# Patient Record
Sex: Male | Born: 1940 | Race: White | Hispanic: No | Marital: Married | Smoking: Former smoker
Health system: Southern US, Community
[De-identification: ages and names within clinical notes are randomized; demographics above are authoritative.]

## PROBLEM LIST (undated history)

## (undated) DIAGNOSIS — C801 Malignant (primary) neoplasm, unspecified: Secondary | ICD-10-CM

## (undated) DIAGNOSIS — C7931 Secondary malignant neoplasm of brain: Secondary | ICD-10-CM

## (undated) DIAGNOSIS — C3490 Malignant neoplasm of unspecified part of unspecified bronchus or lung: Secondary | ICD-10-CM

## (undated) DIAGNOSIS — M1990 Unspecified osteoarthritis, unspecified site: Secondary | ICD-10-CM

## (undated) DIAGNOSIS — I2119 ST elevation (STEMI) myocardial infarction involving other coronary artery of inferior wall: Secondary | ICD-10-CM

## (undated) DIAGNOSIS — R51 Headache: Secondary | ICD-10-CM

## (undated) DIAGNOSIS — I739 Peripheral vascular disease, unspecified: Secondary | ICD-10-CM

## (undated) DIAGNOSIS — I2510 Atherosclerotic heart disease of native coronary artery without angina pectoris: Secondary | ICD-10-CM

## (undated) HISTORY — PX: HEMORROIDECTOMY: SUR656

## (undated) HISTORY — DX: Secondary malignant neoplasm of brain: C79.31

## (undated) HISTORY — DX: Secondary malignant neoplasm of brain: C80.1

## (undated) HISTORY — DX: Malignant neoplasm of unspecified part of unspecified bronchus or lung: C34.90

---

## 2010-12-12 ENCOUNTER — Encounter (HOSPITAL_COMMUNITY)
Admission: EM | Disposition: A | Discharge status: Home / Self Care | Payer: Self-pay | Source: Home / Self Care | Attending: Cardiology

## 2010-12-12 ENCOUNTER — Encounter: Payer: Self-pay | Admitting: Emergency Medicine

## 2010-12-12 ENCOUNTER — Other Ambulatory Visit: Payer: Self-pay

## 2010-12-12 ENCOUNTER — Inpatient Hospital Stay (HOSPITAL_COMMUNITY)
Admission: EM | Admit: 2010-12-12 | Discharge: 2010-12-15 | DRG: 247 | Disposition: A | Discharge status: Home / Self Care | Payer: Medicare Other | Attending: Cardiology | Admitting: Cardiology

## 2010-12-12 ENCOUNTER — Inpatient Hospital Stay (HOSPITAL_COMMUNITY): Payer: Medicare Other

## 2010-12-12 DIAGNOSIS — M129 Arthropathy, unspecified: Secondary | ICD-10-CM | POA: Diagnosis present

## 2010-12-12 DIAGNOSIS — R51 Headache: Secondary | ICD-10-CM | POA: Diagnosis present

## 2010-12-12 DIAGNOSIS — I708 Atherosclerosis of other arteries: Secondary | ICD-10-CM | POA: Diagnosis present

## 2010-12-12 DIAGNOSIS — I10 Essential (primary) hypertension: Secondary | ICD-10-CM | POA: Diagnosis present

## 2010-12-12 DIAGNOSIS — R079 Chest pain, unspecified: Secondary | ICD-10-CM

## 2010-12-12 DIAGNOSIS — I2119 ST elevation (STEMI) myocardial infarction involving other coronary artery of inferior wall: Principal | ICD-10-CM | POA: Diagnosis present

## 2010-12-12 DIAGNOSIS — Z7982 Long term (current) use of aspirin: Secondary | ICD-10-CM

## 2010-12-12 DIAGNOSIS — I2510 Atherosclerotic heart disease of native coronary artery without angina pectoris: Secondary | ICD-10-CM

## 2010-12-12 DIAGNOSIS — I739 Peripheral vascular disease, unspecified: Secondary | ICD-10-CM | POA: Diagnosis present

## 2010-12-12 HISTORY — DX: ST elevation (STEMI) myocardial infarction involving other coronary artery of inferior wall: I21.19

## 2010-12-12 HISTORY — DX: Unspecified osteoarthritis, unspecified site: M19.90

## 2010-12-12 HISTORY — PX: PERCUTANEOUS CORONARY STENT INTERVENTION (PCI-S): SHX5485

## 2010-12-12 HISTORY — PX: LEFT HEART CATHETERIZATION WITH CORONARY ANGIOGRAM: SHX5451

## 2010-12-12 HISTORY — DX: Peripheral vascular disease, unspecified: I73.9

## 2010-12-12 HISTORY — DX: Headache: R51

## 2010-12-12 HISTORY — DX: Atherosclerotic heart disease of native coronary artery without angina pectoris: I25.10

## 2010-12-12 SURGERY — LEFT HEART CATHETERIZATION WITH CORONARY ANGIOGRAM
Anesthesia: LOCAL

## 2010-12-12 MED ORDER — NITROGLYCERIN 0.2 MG/ML ON CALL CATH LAB
INTRAVENOUS | Status: AC
  Filled 2010-12-12: qty 1

## 2010-12-12 MED ORDER — PRASUGREL HCL 10 MG PO TABS
ORAL_TABLET | ORAL | Status: AC
  Administered 2010-12-13: 10 mg via ORAL
  Filled 2010-12-12: qty 6

## 2010-12-12 MED ORDER — BIVALIRUDIN 250 MG IV SOLR
INTRAVENOUS | Status: AC
Start: 2010-12-12 — End: 2010-12-12
  Filled 2010-12-12: qty 250

## 2010-12-12 MED ORDER — LIDOCAINE HCL (PF) 1 % IJ SOLN
INTRAMUSCULAR | Status: AC
  Filled 2010-12-12: qty 30

## 2010-12-12 MED ORDER — HEPARIN (PORCINE) IN NACL 2-0.9 UNIT/ML-% IJ SOLN
INTRAMUSCULAR | Status: AC
Start: 2010-12-12 — End: 2010-12-12
  Filled 2010-12-12: qty 2000

## 2010-12-12 MED ORDER — SODIUM CHLORIDE 0.9 % IV SOLN
Freq: Once | INTRAVENOUS | Status: AC
  Administered 2010-12-13: via INTRAVENOUS

## 2010-12-12 MED ORDER — FENTANYL CITRATE 0.05 MG/ML IJ SOLN
INTRAMUSCULAR | Status: AC
  Filled 2010-12-12: qty 2

## 2010-12-12 MED ORDER — MIDAZOLAM HCL 2 MG/2ML IJ SOLN
INTRAMUSCULAR | Status: AC
  Filled 2010-12-12: qty 2

## 2010-12-12 MED ORDER — METOPROLOL TARTRATE 25 MG PO TABS
25.0000 mg | ORAL_TABLET | Freq: Two times a day (BID) | ORAL | Status: DC
  Administered 2010-12-13 – 2010-12-15 (×6): 25 mg via ORAL
  Filled 2010-12-12 (×7): qty 1

## 2010-12-12 MED ORDER — HEPARIN SODIUM (PORCINE) 5000 UNIT/ML IJ SOLN
INTRAMUSCULAR | Status: AC
  Filled 2010-12-12: qty 1

## 2010-12-12 NOTE — ED Notes (Signed)
Per EMS:  Patient reports Saturday lifted up playhouse and thought pulled muscle, substernal chest pain intermittent, took ASA at home to relieve pain, ongoing and tonight pain worsen and finally called EMS.  324mg  ASA by EMS, and 2 Nitro sl, pain initially 10/10, now pain 1/10.  Denies SOB, NV.  Complaints, chest pain, left arm and left shoulder pain.

## 2010-12-12 NOTE — ED Notes (Signed)
4000 units heparin IV given by [facility] RN

## 2010-12-12 NOTE — Op Note (Signed)
Cardiac Catheterization Procedure Note  Name: Charles Luna MRN: [id number] DOB: 1940-11-16  Procedure: Left Heart Cath, Selective Coronary Angiography, LV angiography,  PTCA/Stent of RCA  Indication: acute inferior MI   Diagnostic Procedure Details: The right groin was prepped, draped, and anesthetized with 1% lidocaine. Using the modified Seldinger technique, a 6 French sheath was introduced into the right femoral artery. Standard Judkins catheters were used for selective coronary angiography and left ventriculography. Catheter exchanges were performed over a wire.  The diagnostic procedure was well-tolerated without immediate complications.  A JR-4 guide catheter was utilized to cannulate the RCA.  ACT was checked after administration of bivalirudin and was appropriate.  ISTAT was obtained to confirm cr and Hgb.  The RCA lesion was then predilated with a short 2.25 mm compliant balloon, and then the vessel was stented using a 2.75 by 20mm DES Promus Element stent.  Post dil was done with a 2.75 Ragsdale balloon.  LV angiography was performed with a pigtail.  Following this a femoral angio was performed.  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  The sheath was secured into place.  I showed the angio findings of the RCA to the family, and discussed the findings.  He was transferred to CCU in good condition.   PROCEDURAL FINDINGS Hemodynamics: AO 147/73 (104) LV 123/21 No gradient on pullback.  Coronary angiography: Coronary dominance: right  Left mainstem: Heavily calcified without significant obstruction.  Mild plaque throughout.   Left anterior descending (LAD): Heavily calcified vessel with mild luminal irregularities throughout.  The first diagonal is small, and has 70% ostial disease.  The second diagonal is a large bifurcation vessel without critical narrowing.   Left circumflex (LCx): The ramus has segmental 70-75%  disease throughout the proximal portion.   The AV circ has a 75% area of narrowing proximal to three smaller marginal branches  Right coronary artery (RCA): The RCA is a large dominant vessel with a large PDA and very large PLA.  It has modest calcification throughout with mid 40% and distal 30% disease.  In the large PLA at the bend, there is a 95% segmental stenosis over approx 13 mm.  Following stent deployment and dilitation, there was no residual stenosis noted.  The final result was excellent.  There was TIMI 3 flow, and no evidence of edge issues.  Left ventriculography: Left ventricular systolic function is low normal, LVEF is estimated at 55%, with mild inferior hypokinesis.  PCI Data: Vessel - RCA/Segment - 5 Percent Stenosis (pre)  95 TIMI-flow 3 Stent Promus Element 2.1mm by 20mm Percent Stenosis (post) 0 TIMI-flow (post) 3  Final Conclusions:  Inferior MI with successful PCI of PLA with DES  Recommendations: See orders.  Rehab, DAPT, RFR.  Shawnie Pons 12/12/2010, 11:43 PM

## 2010-12-12 NOTE — ED Notes (Signed)
  Pt arrives via ems for chest pain since Sunday evening. Pt currently pain free. Pt given ntg SL and 4 ASA. Pt given 4000 units of heparin ivp per Cardiology.

## 2010-12-12 NOTE — H&P (Addendum)
Physician History and Physical    Charles Luna MRN: [id number] DOB/AGE: [birth year] 70 y.o. Admit date: 12/12/2010  Primary Care Physician: None Primary Cardiologist: None  HPI: 70 yo with no significant past medical history presents with inferior MI. Patient takes no medications other than ASA 81 mg daily and has never had heart trouble.  On Saturday, he helped build a tree house for his niece's children and did fairly heavy work.  On Sunday afternoon, he felt tightness across his chest and down both arms that he attributed to soreness from the work he did the prior afternoon.  Symptoms lasted about 2 hours then resolved.  He was fine today until about 7 pm when the chest tightness returned.  It worsened over the next two hours, so he called EMS.  ECG showed inferior STEMI.  Pain resolved after he received NTG sublingual and he is now CP-free.  Patient had never had chest pain prior to yesterday.  No history of GI bleeding.  He still works as an Radio broadcast assistant.   Review of systems complete and found to be negative unless listed above   PMH:  1. Allergic rhinitis 2. Inferior STEMI this admission  SH: Lives with wife in [place].  Quit tobacco about 10 years ago.  Works as an Radio broadcast assistant full time.   FH: No premature CAD  No home medications.   Physical Exam: Blood pressure 140/63, pulse 77, temperature 97.6 F (36.4 C), temperature source Oral, resp. rate 14, SpO2 100.00%.  General: NAD Neck: No JVD, no thyromegaly or thyroid nodule.  Lungs: Clear to auscultation bilaterally with normal respiratory effort. CV: Nondisplaced PMI.  Heart regular S1/S2, no S3, +S4, no murmur.  No peripheral edema.  No carotid bruit.  Normal pedal pulses.  Abdomen: Soft, nontender, no hepatosplenomegaly, no distention.  Skin: Intact without lesions or rashes.  Neurologic: Alert and oriented x 3.  Psych: Normal affect. Extremities: No clubbing or cyanosis.  HEENT: Normal.   Labs:  Pending   Radiology: Pending EKG: NSR, acute inferior MI  ASSESSMENT AND PLAN:  Patient presented with acute inferior MI.  He is hemodynamically stable.  He received ASA and heparin in the ER and will proceed directly to PCI.    Signed: Marca Ancona 12/12/2010, 9:43 PM Co-Sign MD    The patient was brought to the cath lab with a diagnostic electrocardiogram.  We reviewed with the patient and wife our plans.  Cath lab prepared.  Brought to lab for emergent evaluation.  Shawnie Pons 12/12/2010 11:29 PM

## 2010-12-13 ENCOUNTER — Inpatient Hospital Stay (HOSPITAL_COMMUNITY): Payer: Medicare Other

## 2010-12-13 ENCOUNTER — Encounter (HOSPITAL_COMMUNITY): Payer: Self-pay

## 2010-12-13 ENCOUNTER — Encounter (HOSPITAL_COMMUNITY): Payer: Self-pay | Admitting: Certified Registered Nurse Anesthetist

## 2010-12-13 DIAGNOSIS — I2119 ST elevation (STEMI) myocardial infarction involving other coronary artery of inferior wall: Secondary | ICD-10-CM

## 2010-12-13 LAB — POCT I-STAT, CHEM 8
Calcium, Ion: 1.38 mmol/L — ABNORMAL HIGH (ref 1.12–1.32)
Chloride: 106 mEq/L (ref 96–112)
Glucose, Bld: 139 mg/dL — ABNORMAL HIGH (ref 70–99)
HCT: 38 % — ABNORMAL LOW (ref 39.0–52.0)

## 2010-12-13 LAB — CBC
HCT: 35 % — ABNORMAL LOW (ref 39.0–52.0)
Hemoglobin: 11.9 g/dL — ABNORMAL LOW (ref 13.0–17.0)
WBC: 7.1 10*3/uL (ref 4.0–10.5)

## 2010-12-13 LAB — CARDIAC PANEL(CRET KIN+CKTOT+MB+TROPI)
Relative Index: 16 — ABNORMAL HIGH (ref 0.0–2.5)
Relative Index: 15.9 — ABNORMAL HIGH (ref 0.0–2.5)
Troponin I: >25 ng/mL (ref <0.30)

## 2010-12-13 LAB — BASIC METABOLIC PANEL
BUN: 8 mg/dL (ref 6–23)
CO2: 22 mEq/L (ref 19–32)
Chloride: 108 mEq/L (ref 96–112)
Glucose, Bld: 115 mg/dL — ABNORMAL HIGH (ref 70–99)
Potassium: 4.3 mEq/L (ref 3.5–5.1)

## 2010-12-13 LAB — LIPID PANEL
Cholesterol: 129 mg/dL (ref 0–200)
Total CHOL/HDL Ratio: 3.6 RATIO
Triglycerides: 75 mg/dL (ref <150)
VLDL: 15 mg/dL (ref 0–40)

## 2010-12-13 LAB — MRSA PCR SCREENING: MRSA by PCR: NEGATIVE

## 2010-12-13 MED ORDER — ASPIRIN EC 81 MG PO TBEC
81.0000 mg | DELAYED_RELEASE_TABLET | Freq: Every day | ORAL | Status: DC
  Administered 2010-12-13 – 2010-12-15 (×3): 81 mg via ORAL
  Filled 2010-12-13 (×3): qty 1

## 2010-12-13 MED ORDER — DIAZEPAM 2 MG PO TABS: 2.0000 mg | ORAL_TABLET | ORAL | Status: DC | PRN

## 2010-12-13 MED ORDER — ONDANSETRON HCL 4 MG/2ML IJ SOLN: 4.0000 mg | Freq: Four times a day (QID) | INTRAMUSCULAR | Status: DC | PRN

## 2010-12-13 MED ORDER — ACETAMINOPHEN 325 MG PO TABS: 650.0000 mg | ORAL_TABLET | ORAL | Status: DC | PRN

## 2010-12-13 MED ORDER — PRASUGREL HCL 10 MG PO TABS
10.0000 mg | ORAL_TABLET | Freq: Every day | ORAL | Status: DC
  Administered 2010-12-13 – 2010-12-15 (×3): 10 mg via ORAL
  Filled 2010-12-13 (×3): qty 1

## 2010-12-13 MED ORDER — ROSUVASTATIN CALCIUM 40 MG PO TABS
40.0000 mg | ORAL_TABLET | Freq: Every day | ORAL | Status: DC
  Administered 2010-12-13 – 2010-12-14 (×2): 40 mg via ORAL
  Filled 2010-12-13 (×3): qty 1

## 2010-12-13 MED ORDER — MORPHINE SULFATE 2 MG/ML IJ SOLN: 1.0000 mg | INTRAMUSCULAR | Status: DC | PRN

## 2010-12-13 MED ORDER — ATROPINE SULFATE 1 MG/ML IJ SOLN
INTRAMUSCULAR | Status: AC
  Filled 2010-12-13: qty 1

## 2010-12-13 MED FILL — Dextrose Inj 5%: INTRAVENOUS | Qty: 50 | Status: AC

## 2010-12-13 NOTE — Progress Notes (Signed)
CRITICAL VALUE ALERT  Critical value received:  CKMB-84.7 Troponin-15.06   Date of notification:  12/12/10  Time of notification: 23:55 pm   Critical value read back:yes  Nurse who received alert:  Natividad Brood   MD notified (1st page):  Dr. Riley Kill  Time of first page:  23:55   MD notified (2nd page):  Time of second page:  Responding MD: Dr. Riley Kill  Time MD responded: 23:55

## 2010-12-13 NOTE — Progress Notes (Signed)
Subjective:  Patient feels good.  No chest pain.  Groin stable.  No cough.   Objective:  Vital Signs in the last 24 hours: Temp:  [97.5 F (36.4 C)-97.6 F (36.4 C)] 97.5 F (36.4 C) (11/20 0400) Pulse Rate:  [56-84] 66  (11/20 0700) Resp:  [13-20] 17  (11/20 0400) BP: (106-154)/(59-80) 122/71 mmHg (11/20 0700) SpO2:  [96 %-100 %] 96 % (11/20 0700) Arterial Line BP: (150-195)/(70-108) 195/108 mmHg (11/20 0045) Weight:  [80.5 kg (177 lb 7.5 oz)] 177 lb 7.5 oz (80.5 kg) (11/20 0000)  Intake/Output from previous day: 11/19 0701 - 11/20 0700 In: 1050 [I.V.:1050] Out: 400 [Urine:400]   Physical Exam: General: Well developed, well nourished, in no acute distress. Head:  Normocephalic and atraumatic. Lungs: Clear to auscultation and percussion. Heart: Normal S1 and S2.  No murmur, rubs or gallops.  Pulses: Pulses normal in all 4 extremities. Extremities: No clubbing or cyanosis. No edema.  Small groin echymossis.  No hematoma. Neurologic: Alert and oriented x 3.    Lab Results:  [facility] 12/13/10 0535  WBC 7.1  HGB 11.9*  PLT 194    Basename 12/13/10 0535  NA 137  K 4.3  CL 108  CO2 22  GLUCOSE 115*  BUN 8  CREATININE 0.51    Basename 12/13/10 0535 12/12/10 2355  TROPONINI PENDING 15.06*   Hepatic Function Panel No results found for this basename: PROT,ALBUMIN,AST,ALT,ALKPHOS,BILITOT,BILIDIR,IBILI in the last 72 hours  Basename 12/13/10 0535  CHOL 129   No results found for this basename: PROTIME in the last 72 hours  Imaging: Dg Chest Port 1 View  12/13/2010  *RADIOLOGY REPORT*  Clinical Data: Myocardial infarction.  PORTABLE CHEST - 1 VIEW  Comparison: None.  Findings: Single view of the chest was obtained.  There is no evidence to suggest pulmonary edema.  Slight prominence of the mediastinum is probably related to the portable technique.  Heart size is within normal limits.  Trachea is midline.  No evidence for a large pneumothorax.  IMPRESSION: No acute  chest findings.  Original Report Authenticated By: Richarda Overlie, M.D.    EKG:  NSR.Marland Kitchen Inferior MI, indeterminate age.  Cardiac Studies:  No new studies  Assessment/Plan: Patient Active Hospital Problem List: Acute MI, inferior wall, initial episode of care (12/13/2010)   Assessment: Doing well early post MI   Plan: Begin rehab.  Transfer to floor later today.          Shawnie Pons, MD, [facility], FSCAI 12/13/2010, 8:18 AM

## 2010-12-13 NOTE — Progress Notes (Signed)
Subjective:  He denies any chest pain this morning.  Objective:  Vital Signs in the last 24 hours: Temp:  [97.5 F (36.4 C)-97.6 F (36.4 C)] 97.5 F (36.4 C) (11/20 0400) Pulse Rate:  [56-84] 59  (11/20 0500) Resp:  [13-20] 17  (11/20 0400) BP: (109-154)/(59-80) 119/60 mmHg (11/20 0500) SpO2:  [96 %-100 %] 96 % (11/20 0500) Arterial Line BP: (150-195)/(70-108) 195/108 mmHg (11/20 0045) Weight:  [177 lb 7.5 oz (80.5 kg)] 177 lb 7.5 oz (80.5 kg) (11/20 0000)  Intake/Output from previous day: 11/19 0701 - 11/20 0700 In: 900 [I.V.:900] Out: 400 [Urine:400]  Physical Exam: Pt is alert and oriented, NAD HEENT: normal Neck: JVP - normal, carotids 2+= without bruits Lungs: CTA bilaterally CV: RRR without murmur or gallop Abd: soft, NT, Positive BS, no hepatomegaly Ext: no C/C/E, distal pulses intact and equal Skin: warm/dry no rash  Current Facility-Administered Medications  Medication Dose Route Frequency Provider Last Rate Last Dose  . 0.9 %  sodium chloride infusion   Intravenous Once Shawnie Pons, MD 150 mL/hr at 12/13/10 0500    . aspirin EC tablet 81 mg  81 mg Oral Daily Shawnie Pons, MD      . atropine 1 MG/ML injection           . diazepam (VALIUM) tablet 2 mg  2 mg Oral Q4H PRN Shawnie Pons, MD      . fentaNYL (SUBLIMAZE) 0.05 MG/ML injection           . heparin 2-0.9 UNIT/ML-% infusion           . heparin 5000 UNIT/ML injection           . metoprolol tartrate (LOPRESSOR) tablet 25 mg  25 mg Oral BID Shawnie Pons, MD   25 mg at 12/13/10 0016  . midazolam (VERSED) 2 MG/2ML injection           . midazolam (VERSED) 2 MG/2ML injection           . morphine 2 MG/ML injection 1 mg  1 mg Intravenous Q1H PRN Shawnie Pons, MD      . nitroGLYCERIN (NTG ON-CALL) 0.2 mg/mL injection           . ondansetron (ZOFRAN) injection 4 mg  4 mg Intravenous Q6H PRN Shawnie Pons, MD      . prasugrel (EFFIENT) 10 MG tablet           . prasugrel (EFFIENT) tablet 10 mg  10 mg Oral  Daily Shawnie Pons, MD      . rosuvastatin (CRESTOR) tablet 40 mg  40 mg Oral q1800 Shawnie Pons, MD          Lab Results:  [facility] 12/13/10 0535  WBC 7.1  HGB 11.9*  PLT 194   No results found for this basename: NA:2,K:2,CL:2,CO2:2,GLUCOSE:2,BUN:2,CREATININE:2 in the last 72 hours  Basename 12/12/10 2355  TROPONINI 15.06*    Imaging: 12/12/10 CXR:  Cardiac Studies: 12/12/10 EKG: normal sinus rhythm, ST elevation in leads II,III and aVF. 12/12/10 Cardiac cath:  Left mainstem: Heavily calcified without significant obstruction.   Left anterior descending (LAD): Heavily calcified vessel with mild luminal irregularities throughout. The first diagonal is small, and has 70% ostial disease. The second diagonal is a large bifurcation vessel without critical narrowing.   Left circumflex (LCx): The ramus has segmental 70-75% disease throughout the proximal portion. The AV circ has a 75% area of narrowing proximal to three smaller marginal branches   Right coronary artery (RCA): The  RCA is a large dominant vessel with a large PDA and very large PLA. It has modest calcification throughout with mid 40% and distal 30% disease. In the large PLA at the bend, there is a 95% segmental stenosis over approx 13 mm. Following stent deployment and dilitation, there was no residual stenosis noted. The final result was excellent. There was TIMI 3 flow, and no evidence of edge issues.   Left ventriculography: Left ventricular systolic function is low normal, LVEF is estimated at 55%, with mild inferior hypokinesis.  12/13/10 EKG: normal sinus rhythm, no ST - T wave changes noted.     Assessment/Plan:  70 yo with no significant past medical history presents with inferior MI. Patient takes no medications other than ASA 81 mg daily and has never had heart trouble. On Saturday, he helped build a tree house for his niece's children and did fairly heavy work. On Sunday afternoon, he felt tightness  across his chest and down both arms that he attributed to soreness from the work he did the prior afternoon. Symptoms lasted about 2 hours then resolved. He was fine today until about 7 pm when the chest tightness returned. It worsened over the next two hours, so he called EMS. ECG showed inferior STEMI.  1. Inferior STEMI: s/p PCI of PLA with DES on 12/12/10 He denies any chest pain this morning. Also denies any pain at his cath site. On exam, cath site appears healthy, non tender, no bruit heard. Plan: Continue dual antiplatelet therapy, statin ,BB - blocker.  2. Risk stratification: Will get AIC, FLP.  3. Allergic rhinitis: He  reports some rhinorrhea and watering from eyes.      Continue to monitor.     If it gets worse , we may consider symptomatic treatment with claritin.     Naliya Gish, M.D. 12/13/2010, 6:31 AM

## 2010-12-13 NOTE — Progress Notes (Addendum)
CARDIAC REHAB PHASE I   PRE:  Rate/Rhythm: 79 sr  BP:  Supine:   Sitting: 153/64  Standing:    SaO2:   MODE:  Ambulation: 350 ft   POST:  Rate/Rhythem: 89 sr  BP:  Supine:   Sitting: 168/79  Standing:    SaO2:  0950 - 1055 Pt ambulated steady. Education done. Ok to refer to card rehab phase II.  Rosalie Doctor

## 2010-12-13 NOTE — Progress Notes (Signed)
Dr. Riley Kill pulled 6Fr sheath in patient's Right groin and held manual pressure for 15 minutes. Blair Heys, RN also held an additional 15 minutes of manual  pressure on the site. Pressure Dressing applied to site, and right groin level 0. Pt denies pain, nausea, etc. Will continue to monitor vital signs and groin site appropriately.  Natividad Brood 12/13/2010 2:34 AM

## 2010-12-14 LAB — BASIC METABOLIC PANEL
CO2: 26 mEq/L (ref 19–32)
Calcium: 10.2 mg/dL (ref 8.4–10.5)
Creatinine, Ser: 0.61 mg/dL (ref 0.50–1.35)

## 2010-12-14 LAB — LIPID PANEL
LDL Cholesterol: 75 mg/dL (ref 0–99)
Triglycerides: 81 mg/dL (ref <150)

## 2010-12-14 LAB — CBC
MCH: 31.1 pg (ref 26.0–34.0)
MCHC: 33.9 g/dL (ref 30.0–36.0)
MCV: 91.8 fL (ref 78.0–100.0)
Platelets: 183 10*3/uL (ref 150–400)
RDW: 13.9 % (ref 11.5–15.5)
WBC: 7.2 10*3/uL (ref 4.0–10.5)

## 2010-12-14 NOTE — Progress Notes (Signed)
Subjective:  He is doing well.  No chest pain.  Feels good.   Objective:  Vital Signs in the last 24 hours: Temp:  [97.4 F (36.3 C)-98 F (36.7 C)] 97.4 F (36.3 C) (11/21 0615) Pulse Rate:  [62-85] 85  (11/21 0615) Resp:  [14-20] 14  (11/21 0615) BP: (120-144)/(70-86) 133/70 mmHg (11/21 0615) SpO2:  [95 %-98 %] 95 % (11/21 0615)  Intake/Output from previous day: 11/20 0701 - 11/21 0700 In: 1440 [P.O.:1440] Out: 650 [Urine:650]   Physical Exam: General: Well developed, well nourished, in no acute distress. Head:  Normocephalic and atraumatic. Lungs: Clear to auscultation and percussion. Heart: Normal S1 and S2.  No murmur, rubs or gallops.  Pulses: Pulses normal in all 4 extremities. Extremities: No clubbing or cyanosis. No edema. Neurologic: Alert and oriented x 3.    Lab Results:  [facility] 12/14/10 0545 12/13/10 0535  WBC 7.2 7.1  HGB 12.9* 11.9*  PLT 183 194    Basename 12/14/10 0545 12/13/10 0535  NA 140 137  K 3.8 4.3  CL 107 108  CO2 26 22  GLUCOSE 102* 115*  BUN 11 8  CREATININE 0.61 0.51    Basename 12/13/10 0535 12/12/10 2355  TROPONINI >25.00* 15.06*   Hepatic Function Panel No results found for this basename: PROT,ALBUMIN,AST,ALT,ALKPHOS,BILITOT,BILIDIR,IBILI in the last 72 hours  Basename 12/14/10 0545  CHOL 128   No results found for this basename: PROTIME in the last 72 hours  Imaging: Dg Chest Port 1 View  12/13/2010  *RADIOLOGY REPORT*  Clinical Data: Myocardial infarction.  PORTABLE CHEST - 1 VIEW  Comparison: None.  Findings: Single view of the chest was obtained.  There is no evidence to suggest pulmonary edema.  Slight prominence of the mediastinum is probably related to the portable technique.  Heart size is within normal limits.  Trachea is midline.  No evidence for a large pneumothorax.  IMPRESSION: No acute chest findings.  Original Report Authenticated By: Richarda Overlie, M.D.    EKG:    Cardiac Studies:    Assessment/Plan:    Patient Active Hospital Problem List: Acute MI, inferior wall, initial episode of care (12/13/2010)   Assessment: Stable post MI.  No current chest pain.     Plan: Continue to ambulate and move forward.  He is not having chest pain.  VS are stable.  Meds to continue as ordered.  Home in am.        Shawnie Pons, MD, [facility], [facility] 12/14/2010, 9:36 AM

## 2010-12-14 NOTE — Progress Notes (Signed)
Phase I Cardiac Rehab---11:00-11:15  Pt declined hallway walk this am.  Pt reports he has already walked in hallway X2 independently today.  Education reinforced, specifically low fat diet, risk factor modifications, exercise.  Pt has decided to decline outpatient cardiac rehab.  He prefers to exercise on his own at home.  Understanding verbalized-jrion,rn

## 2010-12-15 ENCOUNTER — Encounter (HOSPITAL_COMMUNITY): Payer: Self-pay | Admitting: Physician Assistant

## 2010-12-15 MED ORDER — NITROGLYCERIN 0.4 MG SL SUBL: 0.4000 mg | SUBLINGUAL_TABLET | SUBLINGUAL | Status: DC | PRN

## 2010-12-15 MED ORDER — ROSUVASTATIN CALCIUM 40 MG PO TABS: 40.0000 mg | ORAL_TABLET | Freq: Every day | ORAL | Status: DC

## 2010-12-15 MED ORDER — PRASUGREL HCL 10 MG PO TABS: 10.0000 mg | ORAL_TABLET | Freq: Every day | ORAL | Status: DC

## 2010-12-15 MED ORDER — ASPIRIN 81 MG PO TBEC: 81.0000 mg | DELAYED_RELEASE_TABLET | Freq: Every day | ORAL | Status: AC

## 2010-12-15 MED ORDER — METOPROLOL TARTRATE 25 MG PO TABS: 25.0000 mg | ORAL_TABLET | Freq: Two times a day (BID) | ORAL | Status: DC

## 2010-12-15 NOTE — Discharge Summary (Signed)
Discharge Summary Patient ID: Charles Castilla Adcock MRN: [id number] DOB/AGE: [birth year] 70 y.o.   Admit date: 12/12/2010 Discharge date: 12/15/2010   PRIMARY CARDIOLOGIST:  Dr.  Shawnie Pons   Consults: none  Reason for Admission:  Inferior STEMI  Discharge Diagnoses:   Past Medical History  Diagnosis Date  . ST elevation myocardial infarction (STEMI) of inferior wall     12/12/10 - tx with Promus DES to the PL artery  . CAD (coronary artery disease)     LHC 12/12/10: oD1 70% (small), pRI 70-75%, AV CFX 75%, mRCA 40%, dRCA 30%, PLA 95% tx with DES, EF 55%, inf HK  . Headache   . Peripheral vascular disease   . Arthritis      Procedures Performed This Admission:   Emergent cardiac catheterization 12/12/10: Left mainstem: Heavily calcified without significant obstruction. Mild plaque throughout.  Left anterior descending (LAD): Heavily calcified vessel with mild luminal irregularities throughout. The first diagonal is small, and has 70% ostial disease. The second diagonal is a large bifurcation vessel without critical narrowing.  Left circumflex (LCx): The ramus has segmental 70-75% disease throughout the proximal portion. The AV circ has a 75% area of narrowing proximal to three smaller marginal branches  Right coronary artery (RCA): The RCA is a large dominant vessel with a large PDA and very large PLA. It has modest calcification throughout with mid 40% and distal 30% disease. In the large PLA at the bend, there is a 95% segmental stenosis over approx 13 mm. Following stent deployment and dilitation, there was no residual stenosis noted. The final result was excellent. There was TIMI 3 flow, and no evidence of edge issues.  Left ventriculography: Left ventricular systolic function is low normal, LVEF is estimated at 55%, with mild inferior hypokinesis  PCI Data:  Vessel - RCA/Segment - 5  Percent Stenosis (pre) 95  TIMI-flow 3  Stent Promus Element 2.10mm by 20mm  Percent Stenosis  (post) 0  TIMI-flow (post) 3   Admission History: Charles Luna is a 70 y.o. male with essentially no past medical history who presented with stuttering chest pain and ECG findings consistent with inferior ST elevation myocardial infarction.  Hospital Course:  He was brought to the cardiac catheterization lab emergently. Cardiac catheterization demonstrated 95% lesion in the posterior lateral artery which was treated with a Promus DES. He had residual disease elsewhere as noted. This was treated medically. His LV function was preserved. He was placed on aspirin, aspirin, beta blocker and statin therapy. He was ambulating without further chest pain. He was evaluated by Dr. Riley Kill 11/22 and felt stable for discharge to home.  Pertinent Labs:  Results for orders placed during the hospital encounter of 12/12/10 (from the past 72 hour(s))  POCT I-STAT, CHEM 8     Status: Abnormal   Collection Time   12/12/10  9:56 PM      Component Value Range Comment   Sodium 141  135 - 145 (mEq/L)    Potassium 3.8  3.5 - 5.1 (mEq/L)    Chloride 106  96 - 112 (mEq/L)    BUN 9  6 - 23 (mg/dL)    Creatinine, Ser 4.09  0.50 - 1.35 (mg/dL)    Glucose, Bld 811 (*) 70 - 99 (mg/dL)    Calcium, Ion 9.14 (*) 1.12 - 1.32 (mmol/L)    TCO2 25  0 - 100 (mmol/L)    Hemoglobin 12.9 (*) 13.0 - 17.0 (g/dL)    HCT 78.2 (*) 95.6 - 52.0 (%)  POCT ACTIVATED CLOTTING TIME     Status: Normal   Collection Time   12/12/10  9:59 PM      Component Value Range Comment   Activated Clotting Time 402     CARDIAC PANEL(CRET KIN+CKTOT+MB+TROPI)     Status: Abnormal   Collection Time   12/12/10 11:55 PM      Component Value Range Comment   Total CK 528 (*) 7 - 232 (U/L)    CK, MB 84.7 (*) 0.3 - 4.0 (ng/mL)    Troponin I 15.06 (*) <0.30 (ng/mL)    Relative Index 16.0 (*) 0.0 - 2.5    MRSA PCR SCREENING     Status: Normal   Collection Time   12/12/10 11:55 PM      Component Value Range Comment   MRSA by PCR NEGATIVE  NEGATIVE      CARDIAC PANEL(CRET KIN+CKTOT+MB+TROPI)     Status: Abnormal   Collection Time   12/13/10  5:35 AM      Component Value Range Comment   Total CK 649 (*) 7 - 232 (U/L)    CK, MB 103.3 (*) 0.3 - 4.0 (ng/mL) CRITICAL VALUE NOTED.  VALUE IS CONSISTENT WITH PREVIOUSLY REPORTED AND CALLED VALUE.   Troponin I >25.00 (*) <0.30 (ng/mL)    Relative Index 15.9 (*) 0.0 - 2.5    CBC     Status: Abnormal   Collection Time   12/13/10  5:35 AM      Component Value Range Comment   WBC 7.1  4.0 - 10.5 (K/uL)    RBC 3.80 (*) 4.22 - 5.81 (MIL/uL)    Hemoglobin 11.9 (*) 13.0 - 17.0 (g/dL)    HCT 95.2 (*) 84.1 - 52.0 (%)    MCV 92.1  78.0 - 100.0 (fL)    MCH 31.3  26.0 - 34.0 (pg)    MCHC 34.0  30.0 - 36.0 (g/dL)    RDW 32.4  40.1 - 02.7 (%)    Platelets 194  150 - 400 (K/uL)   BASIC METABOLIC PANEL     Status: Abnormal   Collection Time   12/13/10  5:35 AM      Component Value Range Comment   Sodium 137  135 - 145 (mEq/L)    Potassium 4.3  3.5 - 5.1 (mEq/L) HEMOLYSIS AT THIS LEVEL MAY AFFECT RESULT   Chloride 108  96 - 112 (mEq/L)    CO2 22  19 - 32 (mEq/L)    Glucose, Bld 115 (*) 70 - 99 (mg/dL)    BUN 8  6 - 23 (mg/dL)    Creatinine, Ser 2.53  0.50 - 1.35 (mg/dL)    Calcium 9.6  8.4 - 10.5 (mg/dL)    GFR calc non Af Amer >90  >90 (mL/min)    GFR calc Af Amer >90  >90 (mL/min)   LIPID PANEL     Status: Abnormal   Collection Time   12/13/10  5:35 AM      Component Value Range Comment   Cholesterol 129  0 - 200 (mg/dL)    Triglycerides 75  <664 (mg/dL)    HDL 36 (*) >40 (mg/dL)    Total CHOL/HDL Ratio 3.6      VLDL 15  0 - 40 (mg/dL)    LDL Cholesterol 78  0 - 99 (mg/dL)   HEMOGLOBIN H4V     Status: Abnormal   Collection Time   12/13/10  9:42 AM      Component Value Range Comment  Hemoglobin A1C 5.8 (*) <5.7 (%)    Mean Plasma Glucose 120 (*) <117 (mg/dL)   BASIC METABOLIC PANEL     Status: Abnormal   Collection Time   12/14/10  5:45 AM      Component Value Range Comment   Sodium  140  135 - 145 (mEq/L)    Potassium 3.8  3.5 - 5.1 (mEq/L)    Chloride 107  96 - 112 (mEq/L)    CO2 26  19 - 32 (mEq/L)    Glucose, Bld 102 (*) 70 - 99 (mg/dL)    BUN 11  6 - 23 (mg/dL)    Creatinine, Ser 1.61  0.50 - 1.35 (mg/dL)    Calcium 09.6  8.4 - 10.5 (mg/dL)    GFR calc non Af Amer >90  >90 (mL/min)    GFR calc Af Amer >90  >90 (mL/min)   CBC     Status: Abnormal   Collection Time   12/14/10  5:45 AM      Component Value Range Comment   WBC 7.2  4.0 - 10.5 (K/uL)    RBC 4.15 (*) 4.22 - 5.81 (MIL/uL)    Hemoglobin 12.9 (*) 13.0 - 17.0 (g/dL)    HCT 04.5 (*) 40.9 - 52.0 (%)    MCV 91.8  78.0 - 100.0 (fL)    MCH 31.1  26.0 - 34.0 (pg)    MCHC 33.9  30.0 - 36.0 (g/dL)    RDW 81.1  91.4 - 78.2 (%)    Platelets 183  150 - 400 (K/uL)   LIPID PANEL     Status: Abnormal   Collection Time   12/14/10  5:45 AM      Component Value Range Comment   Cholesterol 128  0 - 200 (mg/dL)    Triglycerides 81  <956 (mg/dL)    HDL 37 (*) >21 (mg/dL)    Total CHOL/HDL Ratio 3.5      VLDL 16  0 - 40 (mg/dL)    LDL Cholesterol 75  0 - 99 (mg/dL)    Radiology:  Dg Chest Port 1 View  12/13/2010  IMPRESSION: No acute chest findings.  Original Report Authenticated By: Richarda Overlie, M.D.     FOLLOW UP PLANS AND APPOINTMENTS Discharge Orders    Future Orders Please Complete By Expires   Diet - low sodium heart healthy      Increase activity slowly      Driving Restrictions      Comments:   No driving for 2 weeks.   Lifting restrictions      Comments:   No heavy lifting for 2 weeks.   Sexual Activity Restrictions      Comments:   No sexual activity for 2 weeks.   Other Restrictions      Comments:   No work for 2 weeks.  Discuss with Dr.  Shawnie Pons when to return.   Discharge wound care:      Comments:   Call the office for any swelling, bleeding, bruising or fever.      Current Discharge Medication List    START taking these medications   Details  metoprolol tartrate  (LOPRESSOR) 25 MG tablet Take 1 tablet (25 mg total) by mouth 2 (two) times daily. Qty: 60 tablet, Refills: 5    nitroGLYCERIN (NITROSTAT) 0.4 MG SL tablet Place 1 tablet (0.4 mg total) under the tongue every 5 (five) minutes as needed for chest pain. Qty: 25 tablet, Refills: 11  prasugrel (EFFIENT) 10 MG TABS Take 1 tablet (10 mg total) by mouth daily. Qty: 30 tablet, Refills: 11    rosuvastatin (CRESTOR) 40 MG tablet Take 1 tablet (40 mg total) by mouth daily at 6 PM. Qty: 30 tablet, Refills: 5      CONTINUE these medications which have CHANGED   Details  aspirin EC 81 MG EC tablet Take 1 tablet (81 mg total) by mouth daily.      CONTINUE these medications which have NOT CHANGED   Details  diphenhydrAMINE (BENADRYL) 25 mg capsule Take 25 mg by mouth every 6 (six) hours as needed. For allergy symptoms        Follow-up Information    Follow up with Shawnie Pons, MD in 1 week. (the office will call your for an appointment)    Contact information:   [address] [place] [address] [phone]           Total Physician and PA time for this discharge: Greater than 30 minutes.  Signed: Tereso Newcomer, PA-C  12:20 PM 12/15/2010

## 2010-12-15 NOTE — Progress Notes (Signed)
Subjective:  Patient doing well.  Wants to go home.  No chest pain.  Ambulating without difficulty.    Objective:  Vital Signs in the last 24 hours: Temp:  [97.2 F (36.2 C)-98.5 F (36.9 C)] 97.3 F (36.3 C) (11/22 0615) Pulse Rate:  [67-76] 76  (11/22 0615) Resp:  [18-20] 18  (11/22 0615) BP: (113-136)/(62-75) 117/69 mmHg (11/22 0615) SpO2:  [95 %-97 %] 95 % (11/22 0615)  Intake/Output from previous day: 11/21 0701 - 11/22 0700 In: 720 [P.O.:720] Out: -    Physical Exam: General: Well developed, well nourished, in no acute distress. Head:  Normocephalic and atraumatic. Lungs: Clear to auscultation and percussion.  Modest decrease in BS, with slight exp prolongation. Heart: Normal S1 and S2.  No murmur, rubs or gallops.  Groin:  Modest bruising, without major hematoma.  Carefully examined.  No bruit noted.   Extremities: No clubbing or cyanosis. No edema. Neurologic: Alert and oriented x 3.    Lab Results:  [facility] 12/14/10 0545 12/13/10 0535  WBC 7.2 7.1  HGB 12.9* 11.9*  PLT 183 194    Basename 12/14/10 0545 12/13/10 0535  NA 140 137  K 3.8 4.3  CL 107 108  CO2 26 22  GLUCOSE 102* 115*  BUN 11 8  CREATININE 0.61 0.51    Basename 12/13/10 0535 12/12/10 2355  TROPONINI >25.00* 15.06*   Hepatic Function Panel No results found for this basename: PROT,ALBUMIN,AST,ALT,ALKPHOS,BILITOT,BILIDIR,IBILI in the last 72 hours  Basename 12/14/10 0545  CHOL 128   No results found for this basename: PROTIME in the last 72 hours  Imaging: No results found.  EKG:  See tracing of 11/20.  ST resolved.    Cardiac Studies:  See cath report.   Results reviewed with patient and wife.  Will opt toward outpatient exercise study.  Assessment/Plan:  Patient Active Hospital Problem List: Acute MI, inferior wall, initial episode of care (12/13/2010)   Assessment: Stable post inferior MI treated with DES.  Residual CAD   Plan: Continue DAPT, Statin, and BB.  Follow up with  me Monday in office.  No driving for 3 weeks told to patient.  Also groin management discussed with regard to activity.    Hyperlipidemia:  Statin Hypertension:  Continue current meds  Reviewed.  Time of discharge less than 30 minutes today.         Shawnie Pons, MD, [facility], FSCAI 12/15/2010, 9:00 AM

## 2010-12-15 NOTE — Progress Notes (Signed)
Pt given discharge instructions and medication sheet. Pt verbalized understanding of instructions including groin precautions and when to call MD

## 2010-12-19 ENCOUNTER — Encounter: Payer: Self-pay | Admitting: Physician Assistant

## 2010-12-19 ENCOUNTER — Encounter: Payer: Self-pay | Admitting: Cardiology

## 2010-12-20 ENCOUNTER — Other Ambulatory Visit: Payer: Self-pay | Admitting: *Deleted

## 2010-12-20 ENCOUNTER — Encounter: Payer: Self-pay | Admitting: Cardiology

## 2010-12-20 ENCOUNTER — Encounter (INDEPENDENT_AMBULATORY_CARE_PROVIDER_SITE_OTHER): Payer: Medicare Other | Admitting: *Deleted

## 2010-12-20 ENCOUNTER — Ambulatory Visit (INDEPENDENT_AMBULATORY_CARE_PROVIDER_SITE_OTHER): Payer: Medicare Other | Admitting: Cardiology

## 2010-12-20 DIAGNOSIS — I2510 Atherosclerotic heart disease of native coronary artery without angina pectoris: Secondary | ICD-10-CM

## 2010-12-20 DIAGNOSIS — E7800 Pure hypercholesterolemia, unspecified: Secondary | ICD-10-CM

## 2010-12-20 DIAGNOSIS — I10 Essential (primary) hypertension: Secondary | ICD-10-CM

## 2010-12-20 DIAGNOSIS — M79609 Pain in unspecified limb: Secondary | ICD-10-CM

## 2010-12-20 MED ORDER — METOPROLOL TARTRATE 25 MG PO TABS: ORAL_TABLET | ORAL | Status: DC

## 2010-12-20 NOTE — Patient Instructions (Addendum)
Your physician has recommended you make the following change in your medication: INCREASE Metoprolol 25mg  to one and one-half tablet by mouth twice a day  Your physician recommends that you schedule a follow-up appointment in: 3 WEEKS  Your physician has recommended that you have an ultrasound of your groin (cath site) today at 3:30.

## 2010-12-20 NOTE — Progress Notes (Signed)
Patient ID: Charles Luna, male   DOB: 1940/12/16, 70 y.o.   MRN: [id number]

## 2010-12-20 NOTE — Assessment & Plan Note (Signed)
See below

## 2010-12-20 NOTE — Assessment & Plan Note (Signed)
Will need lipid and liver in six weeks.

## 2010-12-20 NOTE — Progress Notes (Signed)
HPI:  Patient is doing well.  He denies any chest pain.  He still has some echymossis, but no pain in the groin.  Walking about with difficulty.    Current Outpatient Prescriptions  Medication Sig Dispense Refill  . aspirin EC 81 MG EC tablet Take 1 tablet (81 mg total) by mouth daily.      . metoprolol tartrate (LOPRESSOR) 25 MG tablet Take 1 tablet (25 mg total) by mouth 2 (two) times daily.  60 tablet  5  . nitroGLYCERIN (NITROSTAT) 0.4 MG SL tablet Place 1 tablet (0.4 mg total) under the tongue every 5 (five) minutes as needed for chest pain.  25 tablet  11  . prasugrel (EFFIENT) 10 MG TABS Take 1 tablet (10 mg total) by mouth daily.  30 tablet  11  . rosuvastatin (CRESTOR) 40 MG tablet Take 1 tablet (40 mg total) by mouth daily at 6 PM.  30 tablet  5    Allergies  Allergen Reactions  . Tylenol (Acetaminophen) Shortness Of Breath    Past Medical History  Diagnosis Date  . ST elevation myocardial infarction (STEMI) of inferior wall     12/12/10 - tx with Promus DES to the PL artery  . CAD (coronary artery disease)     LHC 12/12/10: oD1 70% (small), pRI 70-75%, AV CFX 75%, mRCA 40%, dRCA 30%, PLA 95% tx with DES, EF 55%, inf HK  . Headache   . Peripheral vascular disease   . Arthritis     Past Surgical History  Procedure Date  . Hemorroidectomy     Family History  Problem Relation Age of Onset  . Cancer Mother     History   Social History  . Marital Status: Married    Spouse Name: N/A    Number of Children: N/A  . Years of Education: N/A   Occupational History  . Not on file.   Social History Main Topics  . Smoking status: Former Smoker -- 20 years    Types: Cigarettes    Quit date: 12/13/1990  . Smokeless tobacco: Not on file  . Alcohol Use: No  . Drug Use: No  . Sexually Active: Yes   Other Topics Concern  . Not on file   Social History Narrative  . No narrative on file    ROS: Please see the HPI.  All other systems reviewed and  negative.  PHYSICAL EXAM:  BP 150/76  Pulse 87  Ht 5\' 10"  (1.778 m)  Wt 79.833 kg (176 lb)  BMI 25.25 kg/m2  General: Well developed, well nourished, in no acute distress. Head:  Normocephalic and atraumatic. Neck: no JVD Lungs: Clear to auscultation and percussion. Heart: Normal S1 and S2.  No murmur, rubs or gallops.  Abdomen:  Normal bowel sounds; soft; non tender; no organomegaly Groin:   echymossis with minimal bruit.   Pulses: Pulses normal in all 4 extremities. Extremities: No clubbing or cyanosis. No edema. Neurologic: Alert and oriented x 3.  EKG:  NSR.  Left axis deviation.  Inferior MI.  Nonspecific T wave abnormality ASSESSMENT AND PLAN:

## 2010-12-20 NOTE — Assessment & Plan Note (Addendum)
Residual disease after recent inferior MI.  Doing well from a cardiac standpoint.  HR and BP slightly high, so will increase metoprolol to 37.5 mg twice daily by mouth.   Received DES and is on DAPT with Prasugrel and ASA.

## 2011-01-04 ENCOUNTER — Encounter: Payer: Self-pay | Admitting: Cardiology

## 2011-01-04 ENCOUNTER — Telehealth: Payer: Self-pay | Admitting: Cardiology

## 2011-01-04 ENCOUNTER — Ambulatory Visit (INDEPENDENT_AMBULATORY_CARE_PROVIDER_SITE_OTHER): Payer: Medicare Other | Admitting: Cardiology

## 2011-01-04 DIAGNOSIS — I2510 Atherosclerotic heart disease of native coronary artery without angina pectoris: Secondary | ICD-10-CM

## 2011-01-04 DIAGNOSIS — I10 Essential (primary) hypertension: Secondary | ICD-10-CM

## 2011-01-04 DIAGNOSIS — R0602 Shortness of breath: Secondary | ICD-10-CM

## 2011-01-04 DIAGNOSIS — E7800 Pure hypercholesterolemia, unspecified: Secondary | ICD-10-CM

## 2011-01-04 MED ORDER — METOPROLOL TARTRATE 50 MG PO TABS: 50.0000 mg | ORAL_TABLET | Freq: Two times a day (BID) | ORAL | Status: DC

## 2011-01-04 NOTE — Telephone Encounter (Signed)
Pt's wife calling re pt having SOB, and not sure he needs to wait until next week to be seen, denies chest pain, syncope, dizziness

## 2011-01-04 NOTE — Telephone Encounter (Signed)
Patient's wife called,stated she was worried about patient.States patient just dont look right in his eyes.SOB with exertion.No chest pain,no dizziness.States patient has appointment 01/11/11,but would like patient to be seen sooner.Spoke with Dr. Riley Kill he advised to have patient come to office now and he would see him.Wife will bring patient to office .

## 2011-01-04 NOTE — Patient Instructions (Addendum)
Your physician has requested that you have an echocardiogram. Echocardiography is a painless test that uses sound waves to create images of your heart. It provides your doctor with information about the size and shape of your heart and how well your heart's chambers and valves are working. This procedure takes approximately one hour. There are no restrictions for this procedure.   Your physician recommends that you return for lab work in: WITH ECHO  Your physician recommends that you schedule a follow-up appointment in: AFTER ECHO    INCREASE METOPROLOL TO 50 MG TWICE DAILY

## 2011-01-05 ENCOUNTER — Other Ambulatory Visit (INDEPENDENT_AMBULATORY_CARE_PROVIDER_SITE_OTHER): Payer: Medicare Other | Admitting: *Deleted

## 2011-01-05 ENCOUNTER — Ambulatory Visit (HOSPITAL_COMMUNITY): Payer: Medicare Other | Attending: Cardiovascular Disease | Admitting: Radiology

## 2011-01-05 DIAGNOSIS — R0602 Shortness of breath: Secondary | ICD-10-CM

## 2011-01-05 DIAGNOSIS — R0989 Other specified symptoms and signs involving the circulatory and respiratory systems: Secondary | ICD-10-CM | POA: Insufficient documentation

## 2011-01-05 DIAGNOSIS — I10 Essential (primary) hypertension: Secondary | ICD-10-CM

## 2011-01-05 DIAGNOSIS — I079 Rheumatic tricuspid valve disease, unspecified: Secondary | ICD-10-CM | POA: Insufficient documentation

## 2011-01-05 DIAGNOSIS — E7800 Pure hypercholesterolemia, unspecified: Secondary | ICD-10-CM | POA: Insufficient documentation

## 2011-01-05 DIAGNOSIS — R0609 Other forms of dyspnea: Secondary | ICD-10-CM | POA: Insufficient documentation

## 2011-01-05 DIAGNOSIS — I379 Nonrheumatic pulmonary valve disorder, unspecified: Secondary | ICD-10-CM | POA: Insufficient documentation

## 2011-01-05 LAB — BASIC METABOLIC PANEL
Calcium: 10.4 mg/dL (ref 8.4–10.5)
GFR: 101.35 mL/min (ref >60.00)
Potassium: 4.7 mEq/L (ref 3.5–5.1)
Sodium: 141 mEq/L (ref 135–145)

## 2011-01-05 LAB — CBC WITH DIFFERENTIAL/PLATELET
Eosinophils Absolute: 0.2 10*3/uL (ref 0.0–0.7)
HCT: 38.4 % — ABNORMAL LOW (ref 39.0–52.0)
Lymphs Abs: 1.4 10*3/uL (ref 0.7–4.0)
MCHC: 33.8 g/dL (ref 30.0–36.0)
MCV: 93.1 fl (ref 78.0–100.0)
Monocytes Absolute: 0.9 10*3/uL (ref 0.1–1.0)
Neutrophils Relative %: 68.7 % (ref 43.0–77.0)
Platelets: 262 10*3/uL (ref 150.0–400.0)
RDW: 13.6 % (ref 11.5–14.6)
WBC: 7.9 10*3/uL (ref 4.5–10.5)

## 2011-01-06 ENCOUNTER — Telehealth: Payer: Self-pay | Admitting: Cardiology

## 2011-01-06 NOTE — Telephone Encounter (Signed)
Pt to watch and call back if dizziness persists per Dr Antoine Poche.  Pt cautioned to move slowly and get up gradually.

## 2011-01-06 NOTE — Telephone Encounter (Signed)
Charles Luna is complaining of dizziness this afternoon around 2:00pm when he walked out to the mailbox.  His Lopressor was increased from 37.5mg  bid to 50mg  bid Wed pm.  His dizziness lasted about 30 minutes and BP when dizzy was 150/78.  No other dizzy spells.

## 2011-01-06 NOTE — Telephone Encounter (Signed)
Pt generic's  lopressor was increased on Wednesday night, had dizzy spell today , could this be due to the increase?

## 2011-01-07 NOTE — Assessment & Plan Note (Addendum)
Hard to know if he is symptomatic.  At present, would favor increasing his metoprolol for both HR and BP control.  Rate is 91, and BP is up a bit.  Will titrate meds up, and continue to follow closely.  He will need exercise testing with imaging to assess CFX disease eventually.  Will get an echo and some labs, and schedule for early follow up.

## 2011-01-07 NOTE — Assessment & Plan Note (Signed)
See medication adjustments.

## 2011-01-07 NOTE — Progress Notes (Signed)
   HPI:  The patient's wife called and they asked for him to be seen, sooner than later. She thought he looked weak in the eyes.  However, he said he felt pretty good, and did not think he was doing all that badly.  He has occasional headaches, and perhaps some slight shortness of breath.  Otherwise, he has been stable.   Current Outpatient Prescriptions  Medication Sig Dispense Refill  . aspirin EC 81 MG EC tablet Take 1 tablet (81 mg total) by mouth daily.      . metoprolol tartrate (LOPRESSOR) 50 MG tablet Take 1 tablet (50 mg total) by mouth 2 (two) times daily.  60 tablet  12  . nitroGLYCERIN (NITROSTAT) 0.4 MG SL tablet Place 1 tablet (0.4 mg total) under the tongue every 5 (five) minutes as needed for chest pain.  25 tablet  11  . prasugrel (EFFIENT) 10 MG TABS Take 10 mg by mouth daily.       . rosuvastatin (CRESTOR) 40 MG tablet Take 1 tablet (40 mg total) by mouth daily at 6 PM.  30 tablet  5    Allergies  Allergen Reactions  . Tylenol (Acetaminophen) Shortness Of Breath    Past Medical History  Diagnosis Date  . ST elevation myocardial infarction (STEMI) of inferior wall     12/12/10 - tx with Promus DES to the PL artery  . CAD (coronary artery disease)     LHC 12/12/10: oD1 70% (small), pRI 70-75%, AV CFX 75%, mRCA 40%, dRCA 30%, PLA 95% tx with DES, EF 55%, inf HK  . Headache   . Peripheral vascular disease   . Arthritis     Past Surgical History  Procedure Date  . Hemorroidectomy     Family History  Problem Relation Age of Onset  . Cancer Mother     History   Social History  . Marital Status: Married    Spouse Name: N/A    Number of Children: N/A  . Years of Education: N/A   Occupational History  . Not on file.   Social History Main Topics  . Smoking status: Former Smoker -- 20 years    Types: Cigarettes    Quit date: 12/13/1990  . Smokeless tobacco: Not on file  . Alcohol Use: No  . Drug Use: No  . Sexually Active: Yes   Other Topics Concern  .  Not on file   Social History Narrative  . No narrative on file    ROS: Please see the HPI.  All other systems reviewed and negative.  PHYSICAL EXAM:  BP 157/81  Pulse 100  Ht 5\' 10"  (1.778 m)  Wt 79.379 kg (175 lb)  BMI 25.11 kg/m2  General: Well developed, well nourished, in no acute distress.  Eyes appear ok.  Not pale per se.  Head:  Normocephalic and atraumatic. Neck: no JVD Lungs: Clear to auscultation and percussion. Heart: Normal S1 and S2.  No murmur, rubs or gallops.  Abdomen:  Normal bowel sounds; soft; non tender; no organomegaly Pulses: Pulses normal in all 4 extremities. Extremities: No clubbing or cyanosis. No edema. Neurologic: Alert and oriented x 3.  EKG:  NSR with PACs.  Inferior MI, recent.    ASSESSMENT AND PLAN:

## 2011-01-07 NOTE — Assessment & Plan Note (Signed)
LDL is near target at present.

## 2011-01-11 ENCOUNTER — Ambulatory Visit (INDEPENDENT_AMBULATORY_CARE_PROVIDER_SITE_OTHER): Payer: Medicare Other | Admitting: Cardiology

## 2011-01-11 ENCOUNTER — Encounter: Payer: Self-pay | Admitting: Cardiology

## 2011-01-11 DIAGNOSIS — R0600 Dyspnea, unspecified: Secondary | ICD-10-CM

## 2011-01-11 DIAGNOSIS — R0609 Other forms of dyspnea: Secondary | ICD-10-CM

## 2011-01-11 DIAGNOSIS — I2510 Atherosclerotic heart disease of native coronary artery without angina pectoris: Secondary | ICD-10-CM

## 2011-01-11 NOTE — Progress Notes (Signed)
   HPI:  He is doing much better.  No chest tightness.  Mild SOB.  Wants to return to work.    Current Outpatient Prescriptions  Medication Sig Dispense Refill  . aspirin EC 81 MG EC tablet Take 1 tablet (81 mg total) by mouth daily.      . metoprolol tartrate (LOPRESSOR) 50 MG tablet Take 1 tablet (50 mg total) by mouth 2 (two) times daily.  60 tablet  12  . nitroGLYCERIN (NITROSTAT) 0.4 MG SL tablet Place 1 tablet (0.4 mg total) under the tongue every 5 (five) minutes as needed for chest pain.  25 tablet  11  . prasugrel (EFFIENT) 10 MG TABS Take 10 mg by mouth daily.       . rosuvastatin (CRESTOR) 40 MG tablet Take 1 tablet (40 mg total) by mouth daily at 6 PM.  30 tablet  5    Allergies  Allergen Reactions  . Tylenol (Acetaminophen) Shortness Of Breath    Past Medical History  Diagnosis Date  . ST elevation myocardial infarction (STEMI) of inferior wall     12/12/10 - tx with Promus DES to the PL artery  . CAD (coronary artery disease)     LHC 12/12/10: oD1 70% (small), pRI 70-75%, AV CFX 75%, mRCA 40%, dRCA 30%, PLA 95% tx with DES, EF 55%, inf HK  . Headache   . Peripheral vascular disease   . Arthritis     Past Surgical History  Procedure Date  . Hemorroidectomy     Family History  Problem Relation Age of Onset  . Cancer Mother     History   Social History  . Marital Status: Married    Spouse Name: N/A    Number of Children: N/A  . Years of Education: N/A   Occupational History  . Not on file.   Social History Main Topics  . Smoking status: Former Smoker -- 20 years    Types: Cigarettes    Quit date: 12/13/1990  . Smokeless tobacco: Not on file  . Alcohol Use: No  . Drug Use: No  . Sexually Active: Yes   Other Topics Concern  . Not on file   Social History Narrative  . No narrative on file    ROS: Please see the HPI.  All other systems reviewed and negative.  PHYSICAL EXAM:  BP 140/70  Pulse 71  Ht 5\' 10"  (1.778 m)  Wt 79.379 kg (175 lb)   BMI 25.11 kg/m2  General: Well developed, well nourished, in no acute distress. Head:  Normocephalic and atraumatic. Neck: no JVD Lungs: Minimal expiratory prolongation.  Heart: Normal S1 and S2.  No murmur, rubs or gallops.  Abdomen:  Normal bowel sounds; soft; non tender; no organomegaly Pulses: Pulses normal in all 4 extremities. Extremities: No clubbing or cyanosis. No edema. Neurologic: Alert and oriented x 3.  EKG:  NSR.Marland Kitchen Inferior MI, old.  PACs ASSESSMENT AND PLAN:

## 2011-01-11 NOTE — Patient Instructions (Addendum)
Your physician has requested that you have an exercise stress myoview. For further information please visit [email or web address] Please follow instruction sheet, as given.  Your physician recommends that you schedule a follow-up appointment in: 1 MONTH  Your physician has recommended that you have a pulmonary function test. Pulmonary Function Tests are a group of tests that measure how well air moves in and out of your lungs.  Your physician recommends that you continue on your current medications as directed. Please refer to the Current Medication list given to you today.  Your physician recommends that you return for a FASTING lipid and liver profile in 2 WEEKS--nothing to eat or drink after midnight, lab opens at 8:30

## 2011-01-11 NOTE — Assessment & Plan Note (Signed)
BP under control. Continue medical therapy.

## 2011-01-11 NOTE — Progress Notes (Signed)
Patient ID: Charles Luna, male   DOB: 07/31/1940, 70 y.o.   MRN: [id number]  

## 2011-01-11 NOTE — Assessment & Plan Note (Signed)
Get PFT's.  CXR without acute disease.  Slight exp prolongation on exam.

## 2011-01-11 NOTE — Assessment & Plan Note (Signed)
Patient seems better.  HR under control. Will proceed with stress myocardia imaging to assess residual ischemia.  Will return in one month for results.

## 2011-01-11 NOTE — Assessment & Plan Note (Signed)
Lipid and liver profile

## 2011-01-13 ENCOUNTER — Ambulatory Visit (INDEPENDENT_AMBULATORY_CARE_PROVIDER_SITE_OTHER): Payer: Medicare Other | Admitting: Internal Medicine

## 2011-01-13 DIAGNOSIS — R0600 Dyspnea, unspecified: Secondary | ICD-10-CM

## 2011-01-13 DIAGNOSIS — R0989 Other specified symptoms and signs involving the circulatory and respiratory systems: Secondary | ICD-10-CM

## 2011-01-13 LAB — PULMONARY FUNCTION TEST

## 2011-01-13 NOTE — Progress Notes (Signed)
PFT done today. 

## 2011-01-20 ENCOUNTER — Encounter: Payer: Self-pay | Admitting: Cardiology

## 2011-01-26 ENCOUNTER — Ambulatory Visit (INDEPENDENT_AMBULATORY_CARE_PROVIDER_SITE_OTHER): Payer: Medicare Other | Admitting: *Deleted

## 2011-01-26 ENCOUNTER — Ambulatory Visit (HOSPITAL_COMMUNITY): Payer: Medicare Other | Attending: Cardiology | Admitting: Radiology

## 2011-01-26 DIAGNOSIS — E785 Hyperlipidemia, unspecified: Secondary | ICD-10-CM | POA: Insufficient documentation

## 2011-01-26 DIAGNOSIS — I252 Old myocardial infarction: Secondary | ICD-10-CM | POA: Insufficient documentation

## 2011-01-26 DIAGNOSIS — R0609 Other forms of dyspnea: Secondary | ICD-10-CM | POA: Insufficient documentation

## 2011-01-26 DIAGNOSIS — I10 Essential (primary) hypertension: Secondary | ICD-10-CM

## 2011-01-26 DIAGNOSIS — R0600 Dyspnea, unspecified: Secondary | ICD-10-CM

## 2011-01-26 DIAGNOSIS — I2510 Atherosclerotic heart disease of native coronary artery without angina pectoris: Secondary | ICD-10-CM

## 2011-01-26 DIAGNOSIS — R0602 Shortness of breath: Secondary | ICD-10-CM

## 2011-01-26 DIAGNOSIS — R0989 Other specified symptoms and signs involving the circulatory and respiratory systems: Secondary | ICD-10-CM | POA: Insufficient documentation

## 2011-01-26 DIAGNOSIS — Z87891 Personal history of nicotine dependence: Secondary | ICD-10-CM | POA: Insufficient documentation

## 2011-01-26 DIAGNOSIS — E7800 Pure hypercholesterolemia, unspecified: Secondary | ICD-10-CM

## 2011-01-26 DIAGNOSIS — I739 Peripheral vascular disease, unspecified: Secondary | ICD-10-CM | POA: Insufficient documentation

## 2011-01-26 LAB — HEPATIC FUNCTION PANEL
ALT: 23 U/L (ref 0–53)
Albumin: 3.8 g/dL (ref 3.5–5.2)
Alkaline Phosphatase: 73 U/L (ref 39–117)
Bilirubin, Direct: 0.1 mg/dL (ref 0.0–0.3)
Total Protein: 6.9 g/dL (ref 6.0–8.3)

## 2011-01-26 MED ORDER — TECHNETIUM TC 99M TETROFOSMIN IV KIT
33.0000 | PACK | Freq: Once | INTRAVENOUS | Status: AC | PRN
  Administered 2011-01-26: 33 via INTRAVENOUS

## 2011-01-26 MED ORDER — TECHNETIUM TC 99M TETROFOSMIN IV KIT
11.0000 | PACK | Freq: Once | INTRAVENOUS | Status: AC | PRN
  Administered 2011-01-26: 11 via INTRAVENOUS

## 2011-01-26 NOTE — Progress Notes (Signed)
[facility] SITE 3 NUCLEAR MED [address] [place] [address] [phone]  Cardiology Nuclear Med Charles Luna is a 71 y.o. male [id number] 1940-02-06   Nuclear Med Background Indication for Stress Test:  Evaluation for Ischemia, Stent Patency and Post Hospital-S/p STEMI (Inferior) History: 11/12 Heart Catheterization-residual CAD,11/12 Myocardial Infarction and 11/12 Stents- PLA Cardiac Risk Factors: History of Smoking, Hypertension, Lipids and PVD  Symptoms:  DOE and SOB   Nuclear Pre-Procedure Caffeine/Decaff Intake:  None NPO After: 7:00pm   Lungs:  Clear IV 0.9% NS with Angio Cath:  20g  IV Site: R Antecubital  IV Started by:  Stanton Kidney, EMT-P  Chest Size (in):  44 Cup Size: n/a  Height: 5' 10.5" (1.791 m)  Weight:  174 lb (78.926 kg)  BMI:  Body mass index is 24.61 kg/(m^2). Tech Comments:  Metoprolol held > 24 hours, per patient.    Nuclear Med Study 1 or 2 day study: 1 day  Stress Test Type:  Stress  Reading MD: Olga Millers, MD  Order Authorizing Provider:  Shawnie Pons, MD  Resting Radionuclide: Technetium 75m Tetrofosmin  Resting Radionuclide Dose: 11.0 mCi   Stress Radionuclide:  Technetium 58m Tetrofosmin  Stress Radionuclide Dose: 33.0 mCi           Stress Protocol Rest HR: 88 Stress HR: 153  Rest BP: 154/91 Stress BP: 194/94  Exercise Time (min): 4:27 METS: 4.6   Predicted Max HR: 150 bpm % Max HR: 102 bpm Rate Pressure Product: 46962   Dose of Adenosine (mg):  n/a Dose of Lexiscan: 0.4 mg  Dose of Atropine (mg): n/a Dose of Dobutamine: n/a mcg/kg/min (at max HR)  Stress Test Technologist: Bonnita Levan, RN  Nuclear Technologist:  Domenic Polite, CNMT     Rest Procedure:  Myocardial perfusion imaging was performed at rest 45 minutes following the intravenous administration of Technetium 1m Tetrofosmin. Rest ECG: NSR with PAC's, PVC's, T wave abnormality  Stress Procedure:  The patient exercised for 4:27.  The  patient stopped due to extreme dyspnea and fatigue and denied any chest pain. O2 Sats remained above 98%. There were frequent PVC's and PAC's noted. Occ.couplets noted.  There were no significant ST-T wave changes.  Technetium 26m Tetrofosmin was injected at peak exercise and myocardial perfusion imaging was performed after a brief delay. Stress ECG: No significant ST segment change suggestive of ischemia.  QPS Raw Data Images:  Acquisition technically good; normal left ventricular size. Stress Images:  There is decreased uptake in the inferolateral wall. Rest Images:  There is decreased uptake in the inferolateral wall. Subtraction (SDS):  No evidence of ischemia. Transient Ischemic Dilatation (Normal <1.22):  0.99 Lung/Heart Ratio (Normal <0.45):  0.21  Quantitative Gated Spect Images QGS EDV:  98 ml QGS ESV:  43 ml QGS cine images:  NL LV Function; NL Wall Motion QGS EF: 56%  Impression Exercise Capacity:  Poor exercise capacity. BP Response:  Normal blood pressure response. Clinical Symptoms:  No chest pain. ECG Impression:  No significant ST segment change suggestive of ischemia. Comparison with Prior Nuclear Study: No images to compare  Overall Impression:  Abnormal stress nuclear study with a small fixed inferolateral defect consistent with small prior infarct; no ischemia.  Olga Millers

## 2011-02-01 LAB — LIPID PANEL
HDL: 34.4 mg/dL — ABNORMAL LOW (ref >39.00)
Total CHOL/HDL Ratio: 3
Triglycerides: 86 mg/dL (ref 0.0–149.0)

## 2011-02-24 ENCOUNTER — Encounter: Payer: Self-pay | Admitting: Cardiology

## 2011-02-24 ENCOUNTER — Ambulatory Visit (INDEPENDENT_AMBULATORY_CARE_PROVIDER_SITE_OTHER): Payer: Medicare Other | Admitting: Cardiology

## 2011-02-24 DIAGNOSIS — E7800 Pure hypercholesterolemia, unspecified: Secondary | ICD-10-CM

## 2011-02-24 DIAGNOSIS — R0600 Dyspnea, unspecified: Secondary | ICD-10-CM

## 2011-02-24 DIAGNOSIS — I2510 Atherosclerotic heart disease of native coronary artery without angina pectoris: Secondary | ICD-10-CM

## 2011-02-24 DIAGNOSIS — R0989 Other specified symptoms and signs involving the circulatory and respiratory systems: Secondary | ICD-10-CM

## 2011-02-24 MED ORDER — ROSUVASTATIN CALCIUM 20 MG PO TABS: 20.0000 mg | ORAL_TABLET | Freq: Every day | ORAL | Status: DC

## 2011-02-24 NOTE — Patient Instructions (Signed)
Your physician has recommended you make the following change in your medication: DECREASE Crestor to 20mg  daily  Your physician recommends that you return for a FASTING LIPID and LIVER Profile in 6 WEEKS--nothing to eat or drink after midnight, lab opens at 8:30  You have been referred to Dr Maple Hudson in Pulmonary due to abnormal PFTs.   Your physician recommends that you schedule a follow-up appointment in: 2 MONTHS with Dr Riley Kill

## 2011-03-17 NOTE — Assessment & Plan Note (Signed)
He is currently stable.  See results of nuclear scan.  He could have some ischemia in the territory of the circ, but he did not have ECG change and he is not having chest pain.  He does have abnormal PFTs.  Will get pulm consult, continue to monitor.  Should remain on DAPT.

## 2011-03-17 NOTE — Progress Notes (Signed)
HPI:  Since last visit he has been stable.  He underwent radionuclide evaluation because of residual disease, and decision making regarding the residual disease.  Nuclear imaging revealed the following:  Exercise Capacity: Poor exercise capacity.  BP Response: Normal blood pressure response.  Clinical Symptoms: No chest pain.  ECG Impression: No significant ST segment change suggestive of ischemia.  Comparison with Prior Nuclear Study: No images to compare  Overall Impression: Abnormal stress nuclear study with a small fixed inferolateral defect consistent with small prior infarct; no ischemia.  Olga Millers   He had no ST changes, but did have liimited exercise tolerance, and significant dyspnea with exertion.  He underwent PFT evaluation, and this revealed normal lung volumes and diffusion, but did show decrease flow volumes with a significant response to bronchodilators.  As such, we have made the suggestion that he see pulmonary medicine to see if this will help.     Current Outpatient Prescriptions  Medication Sig Dispense Refill  . aspirin EC 81 MG EC tablet Take 1 tablet (81 mg total) by mouth daily.      . metoprolol tartrate (LOPRESSOR) 50 MG tablet Take 1 tablet (50 mg total) by mouth 2 (two) times daily.  60 tablet  12  . nitroGLYCERIN (NITROSTAT) 0.4 MG SL tablet Place 1 tablet (0.4 mg total) under the tongue every 5 (five) minutes as needed for chest pain.  25 tablet  11  . prasugrel (EFFIENT) 10 MG TABS Take 10 mg by mouth daily.       . rosuvastatin (CRESTOR) 20 MG tablet Take 1 tablet (20 mg total) by mouth daily.  30 tablet  6    Allergies  Allergen Reactions  . Tylenol (Acetaminophen) Shortness Of Breath    Past Medical History  Diagnosis Date  . ST elevation myocardial infarction (STEMI) of inferior wall     12/12/10 - tx with Promus DES to the PL artery  . CAD (coronary artery disease)     LHC 12/12/10: oD1 70% (small), pRI 70-75%, AV CFX 75%, mRCA 40%, dRCA  30%, PLA 95% tx with DES, EF 55%, inf HK  . Headache   . Peripheral vascular disease   . Arthritis     Past Surgical History  Procedure Date  . Hemorroidectomy     Family History  Problem Relation Age of Onset  . Cancer Mother     History   Social History  . Marital Status: Married    Spouse Name: N/A    Number of Children: N/A  . Years of Education: N/A   Occupational History  . Not on file.   Social History Main Topics  . Smoking status: Former Smoker -- 20 years    Types: Cigarettes    Quit date: 12/13/1990  . Smokeless tobacco: Not on file  . Alcohol Use: No  . Drug Use: No  . Sexually Active: Yes   Other Topics Concern  . Not on file   Social History Narrative  . No narrative on file    ROS: Please see the HPI.  All other systems reviewed and negative.  PHYSICAL EXAM:  BP 130/76  Pulse 64  Ht 5' 10.5" (1.791 m)  Wt 178 lb (80.74 kg)  BMI 25.18 kg/m2  General: Well developed, well nourished, in no acute distress. Head:  Normocephalic and atraumatic. Neck: no JVD Lungs: no rales, but some prolonged expiration.   Heart: Normal S1 and S2.  No murmur, rubs or gallops.  Abdomen:  Normal bowel sounds; soft; non tender; no organomegaly Pulses: Pulses normal in all 4 extremities. Extremities: No clubbing or cyanosis. No edema. Neurologic: Alert and oriented x 3.  EKG:  ECHO 12/12 Study Conclusions  - Left ventricle: Distal septal hypokinesis The cavity size was normal. Wall thickness was normal. Systolic function was normal. The estimated ejection fraction was in the range of 50% to 55%. - Atrial septum: No defect or patent foramen ovale was identified.     ASSESSMENT AND PLAN:

## 2011-03-17 NOTE — Assessment & Plan Note (Signed)
See PFT results.  Response to bronchodilators although diffusion and lung volumes are normal.  No acute changes on single xray.   Will have pulmonary evaluate for bronchodilator therapy in setting of recent cardiac event.

## 2011-03-17 NOTE — Assessment & Plan Note (Signed)
Given these low values might opt for reducing Crestor to 20mg  per day, and rechecking his values in six weeks.  TS

## 2011-03-21 ENCOUNTER — Encounter: Payer: Self-pay | Admitting: Internal Medicine

## 2011-03-21 ENCOUNTER — Ambulatory Visit (INDEPENDENT_AMBULATORY_CARE_PROVIDER_SITE_OTHER): Payer: Medicare Other | Admitting: Internal Medicine

## 2011-03-21 VITALS — BP 140/72 | HR 62 | Ht 70.0 in | Wt 180.2 lb

## 2011-03-21 DIAGNOSIS — J449 Chronic obstructive pulmonary disease, unspecified: Secondary | ICD-10-CM

## 2011-03-21 MED ORDER — TIOTROPIUM BROMIDE MONOHYDRATE 18 MCG IN CAPS: 18.0000 ug | ORAL_CAPSULE | Freq: Every day | RESPIRATORY_TRACT | Status: DC

## 2011-03-21 NOTE — Patient Instructions (Signed)
Sample / script Spiriva   1 daily Watch for improvement in shortness of breath   Please call as needed  You have mild obstructive airways disease- mostly asthma and bronchitis

## 2011-03-21 NOTE — Progress Notes (Signed)
03/21/11- 9 yoM former smoker referred by DR Riley Kill because of abnormal PFT, complicated by CAD/ MI, HBP Quit smoking 1 pack per day in 1992. Had pneumonia vaccine and flu vaccine in 2012. No primary physician. He says activity is unlimited if he paces himself especially on hills and stairs or when hurrying. No sudden changes. Denies prior diagnosis of any lung disease but has been aware of this shortness of breath for 10-12 years. Occasional morning cough is not sensitive to winter temperatures. Occasional nasal congestion. Breathes comfortably while asleep. Denies wheezing. Snores some but not reported apnea. Allergy vaccine when Clorinda Wyble, for allergic rhinitis. No history of pneumonia or asthma. Active gardening, hunting, fishing, woodworking. Denies family history of respiratory problems. 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 Can't exclude some asbestos exposure when Rika Daughdrill. PFT- 01/13/2011-mild obstructive airways disease with response to bronchodilator. Normal lung volumes. Normal diffusion. FEV1/FVC 0.69. Small airway obstruction response to bronchodilator.  ROS-see HPI Constitutional:   No-   weight loss, night sweats, fevers, chills, fatigue, lassitude. HEENT:   No-  headaches, difficulty swallowing, tooth/dental problems, sore throat,       No-  sneezing, itching, ear ache, nasal congestion, post nasal drip,  CV:  No-   chest pain, orthopnea, PND, swelling in lower extremities, anasarca, dizziness, palpitations Resp: +  shortness of breath with exertion or at rest.              No-   productive cough,  + non-productive cough,  No- coughing up of blood.              No-   change in color of mucus.  No- wheezing.   Skin: No-   rash or lesions. GI:  No-   heartburn, indigestion, abdominal pain, nausea, vomiting, diarrhea,                 change in bowel habits, loss of appetite GU: No-   dysuria,  MS:  No-   joint pain or  swelling. Marland Kitchen  No- back pain. Neuro-     nothing unusual Psych:  No- change in mood or affect. No depression or anxiety.  No memory loss.  OBJ- Physical Exam General- Alert, Oriented, Affect-appropriate, Distress- none acute Skin- rash-none, lesions- none, excoriation- none Lymphadenopathy- none Head- atraumatic            Eyes- Gross vision intact, PERRLA, conjunctivae and secretions clear            Ears- Hearing, canals-normal            Nose- Clear, no-Septal dev, mucus, polyps, erosion, perforation             Throat- Mallampati III , mucosa clear , drainage- none, tonsils- atrophic Neck- flexible , trachea midline, no stridor , thyroid nl, carotid no bruit Chest - symmetrical excursion , unlabored           Heart/CV- RRR , no murmur , no gallop  , no rub, nl s1 s2                           + JVD- trace , edema- none, stasis changes- none, varices- none           Lung- clear to P&A, wheeze- none, cough- none , dullness-none, rub- none           Chest wall-  Abd- tender-no, distended-no, bowel sounds-present, HSM- no  Br/ Gen/ Rectal- Not done, not indicated Extrem- cyanosis- none, clubbing, none, atrophy- none, strength- nl. Hands cool Neuro- grossly intact to observation

## 2011-03-25 NOTE — Assessment & Plan Note (Signed)
Continued realistic exertion is encouraged. Plan-try sample Spiriva with discussion.

## 2011-04-12 ENCOUNTER — Other Ambulatory Visit (INDEPENDENT_AMBULATORY_CARE_PROVIDER_SITE_OTHER): Payer: Medicare Other

## 2011-04-12 DIAGNOSIS — E7800 Pure hypercholesterolemia, unspecified: Secondary | ICD-10-CM

## 2011-04-12 DIAGNOSIS — I2510 Atherosclerotic heart disease of native coronary artery without angina pectoris: Secondary | ICD-10-CM

## 2011-04-12 LAB — HEPATIC FUNCTION PANEL
ALT: 19 U/L (ref 0–53)
AST: 19 U/L (ref 0–37)
Alkaline Phosphatase: 62 U/L (ref 39–117)
Total Bilirubin: 1.1 mg/dL (ref 0.3–1.2)

## 2011-04-12 LAB — LIPID PANEL
Cholesterol: 94 mg/dL (ref 0–200)
LDL Cholesterol: 42 mg/dL (ref 0–99)
VLDL: 11 mg/dL (ref 0.0–40.0)

## 2011-04-20 ENCOUNTER — Encounter: Payer: Self-pay | Admitting: Cardiology

## 2011-04-20 ENCOUNTER — Ambulatory Visit (INDEPENDENT_AMBULATORY_CARE_PROVIDER_SITE_OTHER): Payer: Medicare Other | Admitting: Cardiology

## 2011-04-20 VITALS — BP 150/68 | HR 70 | Ht 70.5 in | Wt 179.0 lb

## 2011-04-20 DIAGNOSIS — J4489 Other specified chronic obstructive pulmonary disease: Secondary | ICD-10-CM

## 2011-04-20 DIAGNOSIS — I2510 Atherosclerotic heart disease of native coronary artery without angina pectoris: Secondary | ICD-10-CM

## 2011-04-20 DIAGNOSIS — I10 Essential (primary) hypertension: Secondary | ICD-10-CM

## 2011-04-20 DIAGNOSIS — E7800 Pure hypercholesterolemia, unspecified: Secondary | ICD-10-CM

## 2011-04-20 DIAGNOSIS — J449 Chronic obstructive pulmonary disease, unspecified: Secondary | ICD-10-CM

## 2011-04-20 NOTE — Progress Notes (Signed)
   HPI:  He is seen back in follow up.  He saw Dr. Maple Hudson and was placed on Spiriva, and that has helped quite a bit.  He remains overall stable.  The patient underwent PCI of the PLA of the RCA, and had residual CFX disesae.  Radionuclide imaging did not suggest inducible ischemia, and he is being treated medically.  He had dyspnea, but this is improved with pulmonary therapy.  No residual chest pain.    Current Outpatient Prescriptions  Medication Sig Dispense Refill  . aspirin EC 81 MG EC tablet Take 1 tablet (81 mg total) by mouth daily.      . metoprolol tartrate (LOPRESSOR) 50 MG tablet Take 1 tablet (50 mg total) by mouth 2 (two) times daily.  60 tablet  12  . nitroGLYCERIN (NITROSTAT) 0.4 MG SL tablet Place 1 tablet (0.4 mg total) under the tongue every 5 (five) minutes as needed for chest pain.  25 tablet  11  . prasugrel (EFFIENT) 10 MG TABS Take 10 mg by mouth daily.       . rosuvastatin (CRESTOR) 20 MG tablet Take 1 tablet (20 mg total) by mouth daily.  30 tablet  6  . tiotropium (SPIRIVA HANDIHALER) 18 MCG inhalation capsule Place 1 capsule (18 mcg total) into inhaler and inhale daily.  30 capsule  prn    Allergies  Allergen Reactions  . Tylenol (Acetaminophen) Shortness Of Breath    Past Medical History  Diagnosis Date  . ST elevation myocardial infarction (STEMI) of inferior wall     12/12/10 - tx with Promus DES to the PL artery  . CAD (coronary artery disease)     LHC 12/12/10: oD1 70% (small), pRI 70-75%, AV CFX 75%, mRCA 40%, dRCA 30%, PLA 95% tx with DES, EF 55%, inf HK  . Headache   . Peripheral vascular disease   . Arthritis     Past Surgical History  Procedure Date  . Hemorroidectomy     Family History  Problem Relation Age of Onset  . Cancer Mother     History   Social History  . Marital Status: Married    Spouse Name: N/A    Number of Children: N/A  . Years of Education: N/A   Occupational History  . Not on file.   Social History Main Topics    . Smoking status: Former Smoker -- 20 years    Types: Cigarettes    Quit date: 12/13/1990  . Smokeless tobacco: Not on file  . Alcohol Use: No  . Drug Use: No  . Sexually Active: Yes   Other Topics Concern  . Not on file   Social History Narrative  . No narrative on file    ROS: Please see the HPI.  All other systems reviewed and negative.  PHYSICAL EXAM:  BP 150/68  Pulse 70  Ht 5' 10.5" (1.791 m)  Wt 179 lb (81.194 kg)  BMI 25.32 kg/m2  General: Well developed, well nourished, in no acute distress. Head:  Normocephalic and atraumatic. Neck: no JVD Lungs:  Some mildly prolonged expiration.   Heart: Normal S1 and S2.  No murmur, rubs or gallops.  Pulses: Pulses normal in all 4 extremities. Extremities: No clubbing or cyanosis. No edema. Neurologic: Alert and oriented x 3.  EKG:  Sinus rhythm with marked sinus arrhythmia.  Inferior MI, old.   ASSESSMENT AND PLAN:

## 2011-04-20 NOTE — Patient Instructions (Addendum)
Your physician recommends that you schedule a follow-up appointment in: 6 WEEKS  Your physician recommends that you continue on your current medications as directed. Please refer to the Current Medication list given to you today.  

## 2011-05-29 ENCOUNTER — Ambulatory Visit (INDEPENDENT_AMBULATORY_CARE_PROVIDER_SITE_OTHER): Payer: Medicare Other | Admitting: Cardiology

## 2011-05-29 ENCOUNTER — Encounter: Payer: Self-pay | Admitting: Cardiology

## 2011-05-29 VITALS — BP 130/72 | HR 81 | Ht 70.5 in | Wt 179.0 lb

## 2011-05-29 DIAGNOSIS — E7800 Pure hypercholesterolemia, unspecified: Secondary | ICD-10-CM

## 2011-05-29 DIAGNOSIS — I2510 Atherosclerotic heart disease of native coronary artery without angina pectoris: Secondary | ICD-10-CM

## 2011-05-29 DIAGNOSIS — I10 Essential (primary) hypertension: Secondary | ICD-10-CM

## 2011-05-29 NOTE — Progress Notes (Signed)
   HPI:  Patient is in for follow up.  He is doing really well.  No chest pain.  No shortness of breath.  Breathing is improved quite a bit since he started Spiriva.    Current Outpatient Prescriptions  Medication Sig Dispense Refill  . aspirin EC 81 MG EC tablet Take 1 tablet (81 mg total) by mouth daily.      . metoprolol tartrate (LOPRESSOR) 50 MG tablet Take 1 tablet (50 mg total) by mouth 2 (two) times daily.  60 tablet  12  . nitroGLYCERIN (NITROSTAT) 0.4 MG SL tablet Place 1 tablet (0.4 mg total) under the tongue every 5 (five) minutes as needed for chest pain.  25 tablet  11  . prasugrel (EFFIENT) 10 MG TABS Take 10 mg by mouth daily.       . rosuvastatin (CRESTOR) 20 MG tablet Take 1 tablet (20 mg total) by mouth daily.  30 tablet  6  . tiotropium (SPIRIVA HANDIHALER) 18 MCG inhalation capsule Place 1 capsule (18 mcg total) into inhaler and inhale daily.  30 capsule  prn    Allergies  Allergen Reactions  . Tylenol (Acetaminophen) Shortness Of Breath    Past Medical History  Diagnosis Date  . ST elevation myocardial infarction (STEMI) of inferior wall     12/12/10 - tx with Promus DES to the PL artery  . CAD (coronary artery disease)     LHC 12/12/10: oD1 70% (small), pRI 70-75%, AV CFX 75%, mRCA 40%, dRCA 30%, PLA 95% tx with DES, EF 55%, inf HK  . Headache   . Peripheral vascular disease   . Arthritis     Past Surgical History  Procedure Date  . Hemorroidectomy     Family History  Problem Relation Age of Onset  . Cancer Mother     History   Social History  . Marital Status: Married    Spouse Name: N/A    Number of Children: N/A  . Years of Education: N/A   Occupational History  . Not on file.   Social History Main Topics  . Smoking status: Former Smoker -- 20 years    Types: Cigarettes    Quit date: 12/13/1990  . Smokeless tobacco: Not on file  . Alcohol Use: No  . Drug Use: No  . Sexually Active: Yes   Other Topics Concern  . Not on file    Social History Narrative  . No narrative on file    ROS: Please see the HPI.  All other systems reviewed and negative.  PHYSICAL EXAM:  BP 130/72  Pulse 81  Ht 5' 10.5" (1.791 m)  Wt 179 lb (81.194 kg)  BMI 25.32 kg/m2  General: Well developed, well nourished, in no acute distress. Head:  Normocephalic and atraumatic. Neck: no JVD Lungs: Clear to auscultation and percussion.  Minimal prolongation of expiration.   Heart: Normal S1 and S2.  No murmur, rubs or gallops.  Abdomen:  Normal bowel sounds; soft; non tender; no organomegaly Pulses: Pulses normal in all 4 extremities. Extremities: No clubbing or cyanosis. No edema. Neurologic: Alert and oriented x 3.  EKG:  NSR with PACs.  No acute changes.    ASSESSMENT AND PLAN:

## 2011-05-29 NOTE — Assessment & Plan Note (Signed)
Stable at the present time.  Continue current medications.  Prasugrel through early next year.

## 2011-05-29 NOTE — Assessment & Plan Note (Addendum)
See note of Dr. Maple Hudson.  He is some improved on Spiriva.  Continue medical therapy.

## 2011-05-29 NOTE — Patient Instructions (Signed)
Your physician recommends that you schedule a follow-up appointment in: 3 MONTHS with Dr Stuckey  Your physician recommends that you continue on your current medications as directed. Please refer to the Current Medication list given to you today.  

## 2011-05-29 NOTE — Assessment & Plan Note (Signed)
Well controlled 

## 2011-05-29 NOTE — Assessment & Plan Note (Signed)
No recurrent angina.  Imaging study negative for residual ischemia.  Therefore, continue medical therapy.

## 2011-05-29 NOTE — Assessment & Plan Note (Signed)
Still borderline.  Continue to monitor.

## 2011-05-29 NOTE — Assessment & Plan Note (Signed)
Lipid and liver checked last week.  Excellent numbers.  Would continue.  After six months, may consider decreasing to 10mg  per day.

## 2011-05-29 NOTE — Assessment & Plan Note (Signed)
At target.  Continue to monitor serially.

## 2011-06-20 ENCOUNTER — Encounter: Payer: Self-pay | Admitting: Internal Medicine

## 2011-06-20 ENCOUNTER — Ambulatory Visit (INDEPENDENT_AMBULATORY_CARE_PROVIDER_SITE_OTHER): Payer: Medicare Other | Admitting: Internal Medicine

## 2011-06-20 VITALS — BP 118/76 | HR 62 | Ht 70.0 in | Wt 179.4 lb

## 2011-06-20 DIAGNOSIS — J449 Chronic obstructive pulmonary disease, unspecified: Secondary | ICD-10-CM

## 2011-06-20 NOTE — Progress Notes (Signed)
03/21/11- 30 yoM former smoker referred by DR Riley Kill because of abnormal PFT, complicated by CAD/ MI, HBP Quit smoking 1 pack per day in 1992. Had pneumonia vaccine and flu vaccine in 2012. No primary physician. He says activity is unlimited if he paces himself especially on hills and stairs or when hurrying. No sudden changes. Denies prior diagnosis of any lung disease but has been aware of this shortness of breath for 10-12 years. Occasional morning cough is not sensitive to winter temperatures. Occasional nasal congestion. Breathes comfortably while asleep. Denies wheezing. Snores some but not reported apnea. Allergy vaccine when Brianne Maina, for allergic rhinitis. No history of pneumonia or asthma. Active gardening, hunting, fishing, woodworking. Denies family history of respiratory problems. 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 Can't exclude some asbestos exposure when Muadh Creasy. PFT- 01/13/2011-mild obstructive airways disease with response to bronchodilator. Normal lung volumes. Normal diffusion. FEV1/FVC 0.69. Small airway obstruction response to bronchodilator.  06/20/11- 36 yoM former smoker referred by DR Riley Kill because of abnormal PFT, complicated by CAD/ MI, HBP Quit smoking 1 pack per day in 1992. Had pneumonia vaccine and flu vaccine in 2012. No primary physician.  Uses Spiriva and working well; breathing doing good. He likes Spiriva and credits that for his improvement with no acute episodes recently and less productive cough. ///Should get chest x-ray next visit///  ROS-see HPI Constitutional:   No-   weight loss, night sweats, fevers, chills, fatigue, lassitude. HEENT:   No-  headaches, difficulty swallowing, tooth/dental problems, sore throat,       No-  sneezing, itching, ear ache, nasal congestion, post nasal drip,  CV:  No-   chest pain, orthopnea, PND, swelling in lower extremities, anasarca, dizziness, palpitations Resp:  +  shortness of breath with exertion or at rest.              Little  productive cough,  + non-productive cough,  No- coughing up of blood.              No-   change in color of mucus.  No- wheezing.   Skin: No rash GI:  No-   heartburn, indigestion, abdominal pain, nausea, vomiting,  GU: No-   dysuria,  MS:  No-   joint pain or swelling. . Neuro-     nothing unusual Psych:  No- change in mood or affect. No depression or anxiety.  No memory loss.  OBJ- Physical Exam General- Alert, Oriented, Affect-appropriate, Distress- none acute Skin- rash-none, lesions- none, excoriation- none Lymphadenopathy- none Head- atraumatic            Eyes- Gross vision intact, PERRLA, conjunctivae and secretions clear            Ears- Hearing, canals-normal            Nose- Clear, no-Septal dev, mucus, polyps, erosion, perforation             Throat- Mallampati III , mucosa clear , drainage- none, tonsils- atrophic Neck- flexible , trachea midline, no stridor , thyroid nl, carotid no bruit Chest - symmetrical excursion , unlabored           Heart/CV- RRR , no murmur , no gallop  , no rub, nl s1 s2                           + JVD- trace , edema- none, stasis changes- none, varices- none  Lung- clear to P&A, wheeze- none, cough- none , dullness-none, rub- none           Chest wall-  Abd- Br/ Gen/ Rectal- Not done, not indicated Extrem- cyanosis- none, clubbing, none, atrophy- none, strength- nl. Hands cool Neuro- grossly intact to observation

## 2011-06-20 NOTE — Patient Instructions (Signed)
Ok to try off Spiriva from time to time, to see if it is helping at that time. It is also ok to take it every day long term if it makes your breathing feel better.   Please call as needed

## 2011-06-24 NOTE — Assessment & Plan Note (Signed)
Clinically improved with Spiriva. He is doing well enough that we discussed how to try Spiriva on and off, a week at that time, if he wants to see if it is currently helping him.

## 2011-09-12 ENCOUNTER — Ambulatory Visit: Payer: Medicare Other | Admitting: Cardiology

## 2011-10-03 ENCOUNTER — Ambulatory Visit (INDEPENDENT_AMBULATORY_CARE_PROVIDER_SITE_OTHER): Payer: Medicare Other | Admitting: Cardiology

## 2011-10-03 VITALS — BP 124/68 | HR 72 | Ht 70.0 in | Wt 178.8 lb

## 2011-10-03 DIAGNOSIS — J449 Chronic obstructive pulmonary disease, unspecified: Secondary | ICD-10-CM

## 2011-10-03 DIAGNOSIS — E7800 Pure hypercholesterolemia, unspecified: Secondary | ICD-10-CM

## 2011-10-03 DIAGNOSIS — I2510 Atherosclerotic heart disease of native coronary artery without angina pectoris: Secondary | ICD-10-CM

## 2011-10-03 NOTE — Progress Notes (Signed)
   HPI:  The patient returns in followup. I had the patient see Dr. Maple Hudson in the pulmonary clinic, and he was placed on Spriva.  He thinks that this is been helpful.  He denies any chest pain and has been getting along well.  No new symptoms.  No problem with bleeding at the present time.  The patient had stenting of the RCA and has residual disease of the CFX currently on medical therapy.    Current Outpatient Prescriptions  Medication Sig Dispense Refill  . aspirin EC 81 MG EC tablet Take 1 tablet (81 mg total) by mouth daily.      . metoprolol tartrate (LOPRESSOR) 50 MG tablet Take 1 tablet (50 mg total) by mouth 2 (two) times daily.  60 tablet  12  . nitroGLYCERIN (NITROSTAT) 0.4 MG SL tablet Place 1 tablet (0.4 mg total) under the tongue every 5 (five) minutes as needed for chest pain.  25 tablet  11  . prasugrel (EFFIENT) 10 MG TABS Take 10 mg by mouth daily.       . rosuvastatin (CRESTOR) 20 MG tablet Take 1 tablet (20 mg total) by mouth daily.  30 tablet  6  . tiotropium (SPIRIVA HANDIHALER) 18 MCG inhalation capsule Place 1 capsule (18 mcg total) into inhaler and inhale daily.  30 capsule  prn    Allergies  Allergen Reactions  . Tylenol (Acetaminophen) Shortness Of Breath    Past Medical History  Diagnosis Date  . ST elevation myocardial infarction (STEMI) of inferior wall     12/12/10 - tx with Promus DES to the PL artery  . CAD (coronary artery disease)     LHC 12/12/10: oD1 70% (small), pRI 70-75%, AV CFX 75%, mRCA 40%, dRCA 30%, PLA 95% tx with DES, EF 55%, inf HK  . Headache   . Peripheral vascular disease   . Arthritis     Past Surgical History  Procedure Date  . Hemorroidectomy     Family History  Problem Relation Age of Onset  . Cancer Mother     History   Social History  . Marital Status: Married    Spouse Name: N/A    Number of Children: N/A  . Years of Education: N/A   Occupational History  . Not on file.   Social History Main Topics  . Smoking  status: Former Smoker -- 20 years    Types: Cigarettes    Quit date: 12/13/1990  . Smokeless tobacco: Not on file  . Alcohol Use: No  . Drug Use: No  . Sexually Active: Yes   Other Topics Concern  . Not on file   Social History Narrative  . No narrative on file    ROS: Please see the HPI.  All other systems reviewed and negative.  PHYSICAL EXAM:  BP 124/68  Pulse 72  Ht 5\' 10"  (1.778 m)  Wt 178 lb 12.8 oz (81.103 kg)  BMI 25.66 kg/m2  General: Well developed, well nourished, in no acute distress. Head:  Normocephalic and atraumatic. Neck: no JVD Lungs: Clear to auscultation and percussion.  Decrease in overall BS. \ Heart: Normal S1 and S2.  No murmur, rubs or gallops.  Abdomen:  Normal bowel sounds; soft; non tender; no organomegaly Pulses: Pulses normal in all 4 extremities. Extremities: No clubbing or cyanosis. No edema. Neurologic: Alert and oriented x 3.  EKG:  NSR.  WNL.  ASSESSMENT AND PLAN:

## 2011-10-03 NOTE — Patient Instructions (Signed)
Your physician recommends that you schedule a follow-up appointment in:  2 MONTHS WITH DR STUCKEY  Your physician recommends that you continue on your current medications as directed. Please refer to the Current Medication list given to you today. 

## 2011-10-08 NOTE — Assessment & Plan Note (Signed)
Seeing Dr. Maple Hudson, and he is improved.

## 2011-10-08 NOTE — Assessment & Plan Note (Signed)
At target.  Aggressive given underlying residual disease.

## 2011-10-08 NOTE — Assessment & Plan Note (Addendum)
Currently stable with no new symptoms.  He will remain on medical therapy at present, and I will see him back.  He should be able to come off of his Prasugrel at the next office follow up.

## 2011-10-10 ENCOUNTER — Other Ambulatory Visit: Payer: Self-pay | Admitting: *Deleted

## 2011-10-10 DIAGNOSIS — I25810 Atherosclerosis of coronary artery bypass graft(s) without angina pectoris: Secondary | ICD-10-CM

## 2011-10-24 ENCOUNTER — Other Ambulatory Visit: Payer: Self-pay | Admitting: Cardiology

## 2011-12-05 ENCOUNTER — Ambulatory Visit (INDEPENDENT_AMBULATORY_CARE_PROVIDER_SITE_OTHER): Payer: Medicare Other | Admitting: Cardiology

## 2011-12-05 VITALS — BP 141/66 | HR 66 | Ht 70.0 in | Wt 183.0 lb

## 2011-12-05 DIAGNOSIS — I2510 Atherosclerotic heart disease of native coronary artery without angina pectoris: Secondary | ICD-10-CM

## 2011-12-05 DIAGNOSIS — I10 Essential (primary) hypertension: Secondary | ICD-10-CM

## 2011-12-05 DIAGNOSIS — E7800 Pure hypercholesterolemia, unspecified: Secondary | ICD-10-CM

## 2011-12-05 NOTE — Patient Instructions (Addendum)
Your physician recommends that you schedule a follow-up appointment in: 3 months with Dr. Riley Kill.  STOP Effient.

## 2011-12-05 NOTE — Progress Notes (Signed)
   HPI:  The patient is in for followup today. He continues to do quite well.  He's not having chest pain or significant cardiac symptoms at this point in time. He is out nearly a year from taking his dual antiplatelet therapy. The patient had a DES placed in the posterolateral vessel. A Promus Element stent was put in place. He does have residual coronary disease, and is been treated medically. He's also been seeing Dr. Maple Hudson, and the patient is been clinically improved on pulmonary medications. He's not had significant bleeding.   Current Outpatient Prescriptions  Medication Sig Dispense Refill  . aspirin EC 81 MG EC tablet Take 1 tablet (81 mg total) by mouth daily.      . CRESTOR 20 MG tablet take 1 tablet by mouth once daily  30 each  6  . metoprolol tartrate (LOPRESSOR) 50 MG tablet Take 1 tablet (50 mg total) by mouth 2 (two) times daily.  60 tablet  12  . nitroGLYCERIN (NITROSTAT) 0.4 MG SL tablet Place 1 tablet (0.4 mg total) under the tongue every 5 (five) minutes as needed for chest pain.  25 tablet  11  . prasugrel (EFFIENT) 10 MG TABS Take 10 mg by mouth daily.         Allergies  Allergen Reactions  . Tylenol (Acetaminophen) Shortness Of Breath    Past Medical History  Diagnosis Date  . ST elevation myocardial infarction (STEMI) of inferior wall     12/12/10 - tx with Promus DES to the PL artery  . CAD (coronary artery disease)     LHC 12/12/10: oD1 70% (small), pRI 70-75%, AV CFX 75%, mRCA 40%, dRCA 30%, PLA 95% tx with DES, EF 55%, inf HK  . Headache   . Peripheral vascular disease   . Arthritis     Past Surgical History  Procedure Date  . Hemorroidectomy     Family History  Problem Relation Age of Onset  . Cancer Mother     History   Social History  . Marital Status: Married    Spouse Name: N/A    Number of Children: N/A  . Years of Education: N/A   Occupational History  . Not on file.   Social History Main Topics  . Smoking status: Former Smoker --  20 years    Types: Cigarettes    Quit date: 12/13/1990  . Smokeless tobacco: Not on file  . Alcohol Use: No  . Drug Use: No  . Sexually Active: Yes   Other Topics Concern  . Not on file   Social History Narrative  . No narrative on file    ROS: Please see the HPI.  All other systems reviewed and negative.  PHYSICAL EXAM:  BP 141/66  Pulse 66  Ht 5\' 10"  (1.778 m)  Wt 183 lb (83.008 kg)  BMI 26.26 kg/m2  General: Well developed, well nourished, in no acute distress. Head:  Normocephalic and atraumatic. Neck: no JVD Lungs: mildly prolonged expiration.  No definite wheezes.   Heart: Normal S1 and S2.  No murmur, rubs or gallops.  Pulses: Pulses normal in all 4 extremities. Extremities: No clubbing or cyanosis. No edema. Neurologic: Alert and oriented x 3.  EKG:  ASSESSMENT AND PLAN:

## 2011-12-13 NOTE — Assessment & Plan Note (Signed)
Patient's complaint is one year treatment with antiplatelet therapy. Based on multiple trials, and the guidelines, we will go ahead and stop his FE at this time. He will remain on daily aspirin. I reminded him not to miss his  aspirin at all.  He will continue current meds and see me back in follow up.

## 2011-12-13 NOTE — Assessment & Plan Note (Signed)
His blood pressure is reasonably well controlled 

## 2011-12-13 NOTE — Assessment & Plan Note (Signed)
He is at target on current therapy

## 2011-12-26 ENCOUNTER — Ambulatory Visit: Payer: Medicare Other | Admitting: Internal Medicine

## 2012-01-25 ENCOUNTER — Encounter: Payer: Self-pay | Admitting: Internal Medicine

## 2012-01-25 ENCOUNTER — Ambulatory Visit (INDEPENDENT_AMBULATORY_CARE_PROVIDER_SITE_OTHER): Payer: Medicare Other | Admitting: Internal Medicine

## 2012-01-25 ENCOUNTER — Ambulatory Visit (INDEPENDENT_AMBULATORY_CARE_PROVIDER_SITE_OTHER)
Admission: RE | Admit: 2012-01-25 | Discharge: 2012-01-25 | Disposition: A | Discharge status: Home / Self Care | Payer: Medicare Other | Source: Ambulatory Visit | Attending: Internal Medicine | Admitting: Internal Medicine

## 2012-01-25 VITALS — BP 140/84 | Ht 70.0 in | Wt 187.2 lb

## 2012-01-25 DIAGNOSIS — I491 Atrial premature depolarization: Secondary | ICD-10-CM

## 2012-01-25 DIAGNOSIS — J449 Chronic obstructive pulmonary disease, unspecified: Secondary | ICD-10-CM

## 2012-01-25 DIAGNOSIS — I498 Other specified cardiac arrhythmias: Secondary | ICD-10-CM

## 2012-01-25 DIAGNOSIS — R001 Bradycardia, unspecified: Secondary | ICD-10-CM

## 2012-01-25 DIAGNOSIS — I2510 Atherosclerotic heart disease of native coronary artery without angina pectoris: Secondary | ICD-10-CM

## 2012-01-25 NOTE — Progress Notes (Signed)
03/21/11- 58 yoM former smoker referred by DR Riley Kill because of abnormal PFT, complicated by CAD/ MI, HBP Quit smoking 1 pack per day in 1992. Had pneumonia vaccine and flu vaccine in 2012. No primary physician. He says activity is unlimited if he paces himself especially on hills and stairs or when hurrying. No sudden changes. Denies prior diagnosis of any lung disease but has been aware of this shortness of breath for 10-12 years. Occasional morning cough is not sensitive to winter temperatures. Occasional nasal congestion. Breathes comfortably while asleep. Denies wheezing. Snores some but not reported apnea. Allergy vaccine when [facility] [place], for allergic rhinitis. No history of pneumonia or asthma. Active gardening, hunting, fishing, woodworking. Denies family history of respiratory problems. 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 Can't exclude some asbestos exposure when [place]. PFT- 01/13/2011-mild obstructive airways disease with response to bronchodilator. Normal lung volumes. Normal diffusion. FEV1/FVC 0.69. Small airway obstruction response to bronchodilator.  06/20/11- 32 yoM former smoker referred by DR Riley Kill because of abnormal PFT, complicated by CAD/ MI, HBP Quit smoking 1 pack per day in 1992. Had pneumonia vaccine and flu vaccine in 2012. No primary physician.  Uses Spiriva and working well; breathing doing good. He likes Spiriva and credits that for his improvement with no acute episodes recently and less productive cough. ///Should get chest x-ray next visit///  01/25/12- 70 yoM former smoker referred by DR Riley Kill because of abnormal PFT, complicated by CAD/ MI, HBP FOLLOWS FOR: has had ? allergies causing drainage in throat; denies any SOB, wheezing, cough, or congestion. Not having to use nebulizer for a while now. Nurse was concerned about thready,irregular pulse at check-in: EKG- very irregular baseline NSR w/ PACs. He  denies palpitation, chest pain, syncope or dizziness. Says he has not used his breathing medications since last here and feels "fine". He does not notice dyspnea with normal activities but does not run. His legs slow his walking, more than his breathing does.  ROS-see HPI Constitutional:   No-   weight loss, night sweats, fevers, chills, fatigue, lassitude. HEENT:   No-  headaches, difficulty swallowing, tooth/dental problems, sore throat,       No-  sneezing, itching, ear ache, nasal congestion, post nasal drip,  CV:  No-   chest pain, orthopnea, PND, swelling in lower extremities, anasarca, dizziness, palpitations Resp: +  shortness of breath with exertion or at rest.              Little  productive cough,  + non-productive cough,  No- coughing up of blood.              No-   change in color of mucus.  No- wheezing.   Skin: No rash GI:  No-   heartburn, indigestion, abdominal pain, nausea, vomiting,  GU: No-   dysuria,  MS:  No-   joint pain or swelling. . Neuro-     nothing unusual Psych:  No- change in mood or affect. No depression or anxiety.  No memory loss.  OBJ- Physical Exam General- Alert, Oriented, Affect-appropriate, Distress- none acute Skin- rash-none, lesions- none, excoriation- none Lymphadenopathy- none Head- atraumatic            Eyes- Gross vision intact, PERRLA, conjunctivae and secretions clear            Ears- Hearing, canals-normal            Nose- Clear, no-Septal dev, mucus, polyps, erosion, perforation  Throat- Mallampati III , mucosa clear , drainage- none, tonsils- atrophic Neck- flexible , trachea midline, no stridor , thyroid nl, carotid no bruit Chest - symmetrical excursion , unlabored           Heart/CV- + regular rhythm with very frequent extra beats , no murmur , no gallop  , no rub, nl s1 s2                           + JVD- trace , edema- none, stasis changes- none, varices- none           Lung- clear to P&A, wheeze- none, + light cough ,  dullness-none, rub- none           Chest wall-  Abd- Br/ Gen/ Rectal- Not done, not indicated Extrem- cyanosis- none, clubbing, none, atrophy- none, strength- nl. Hands cool Neuro- grossly intact to observation

## 2012-01-25 NOTE — Patient Instructions (Addendum)
Order- CXR dx COPD  You are doing fine to pace your self.  Please call as needed

## 2012-01-26 NOTE — Progress Notes (Signed)
Quick Note:  Pt aware of results. ______ 

## 2012-01-26 NOTE — Progress Notes (Signed)
Quick Note:  ATC patient, patient not home. Left msg for patient to return call. ______

## 2012-02-01 ENCOUNTER — Other Ambulatory Visit: Payer: Self-pay | Admitting: *Deleted

## 2012-02-01 DIAGNOSIS — R0602 Shortness of breath: Secondary | ICD-10-CM

## 2012-02-01 DIAGNOSIS — I10 Essential (primary) hypertension: Secondary | ICD-10-CM

## 2012-02-01 MED ORDER — METOPROLOL TARTRATE 50 MG PO TABS: 50.0000 mg | ORAL_TABLET | Freq: Two times a day (BID) | ORAL | Status: DC

## 2012-02-04 NOTE — Assessment & Plan Note (Addendum)
Well controlled now. He has not been using his bronchodilators. We discussed potential interaction between them and his PACs, and between his beta blocker and his breathing. Medications may need adjustment in the future but he is doing well now. Plan- chest x-ray

## 2012-02-04 NOTE — Assessment & Plan Note (Signed)
Watch for interaction between beta blockers, stimulant bronchodilators, bronchospasm and cardiac rhythm

## 2012-03-05 ENCOUNTER — Encounter: Payer: Self-pay | Admitting: Cardiology

## 2012-03-05 ENCOUNTER — Ambulatory Visit (INDEPENDENT_AMBULATORY_CARE_PROVIDER_SITE_OTHER): Payer: Medicare Other | Admitting: Cardiology

## 2012-03-05 VITALS — BP 142/58 | HR 66 | Ht 70.5 in | Wt 186.0 lb

## 2012-03-05 DIAGNOSIS — E7800 Pure hypercholesterolemia, unspecified: Secondary | ICD-10-CM

## 2012-03-05 DIAGNOSIS — I10 Essential (primary) hypertension: Secondary | ICD-10-CM

## 2012-03-05 DIAGNOSIS — I2510 Atherosclerotic heart disease of native coronary artery without angina pectoris: Secondary | ICD-10-CM

## 2012-03-05 NOTE — Patient Instructions (Addendum)
Your physician recommends that you return for a FASTING LIPID and LIVER profile--nothing to eat or drink after midnight, lab opens at 7:30 (03/12/12)  Your physician wants you to follow-up in: 6 MONTHS with Dr Excell Seltzer (previous pt of Dr  Riley Kill). You will receive a reminder letter in the mail two months in advance. If you don't receive a letter, please call our office to schedule the follow-up appointment.  Your physician recommends that you continue on your current medications as directed. Please refer to the Current Medication list given to you today.

## 2012-03-05 NOTE — Progress Notes (Signed)
.     HPI:  This very nice gentleman seen in a followup office visit. Clinically, he seems to be doing extremely well. He denies any ongoing issues at present. The patient is followed by Dr. Maple Hudson. He is on fairly limited medication regimen.  The patient denies any ongoing chest pain.   Current Outpatient Prescriptions  Medication Sig Dispense Refill  . aspirin 81 MG tablet Take 81 mg by mouth daily.      . CRESTOR 20 MG tablet take 1 tablet by mouth once daily  30 each  6  . metoprolol (LOPRESSOR) 50 MG tablet Take 1 tablet (50 mg total) by mouth 2 (two) times daily.  60 tablet  12  . nitroGLYCERIN (NITROSTAT) 0.4 MG SL tablet Place 1 tablet (0.4 mg total) under the tongue every 5 (five) minutes as needed for chest pain.  25 tablet  11   No current facility-administered medications for this visit.    Allergies  Allergen Reactions  . Tylenol (Acetaminophen) Shortness Of Breath    Past Medical History  Diagnosis Date  . ST elevation myocardial infarction (STEMI) of inferior wall     12/12/10 - tx with Promus DES to the PL artery  . CAD (coronary artery disease)     LHC 12/12/10: oD1 70% (small), pRI 70-75%, AV CFX 75%, mRCA 40%, dRCA 30%, PLA 95% tx with DES, EF 55%, inf HK  . Headache   . Peripheral vascular disease   . Arthritis     Past Surgical History  Procedure Laterality Date  . Hemorroidectomy      Family History  Problem Relation Age of Onset  . Cancer Mother     History   Social History  . Marital Status: Married    Spouse Name: N/A    Number of Children: N/A  . Years of Education: N/A   Occupational History  . Not on file.   Social History Main Topics  . Smoking status: Former Smoker -- 20 years    Types: Cigarettes    Quit date: 12/13/1990  . Smokeless tobacco: Not on file  . Alcohol Use: No  . Drug Use: No  . Sexually Active: Yes   Other Topics Concern  . Not on file   Social History Narrative  . No narrative on file    ROS: Please see  the HPI.  All other systems reviewed and negative.  PHYSICAL EXAM:  BP 142/58  Pulse 66  Ht 5' 10.5" (1.791 m)  Wt 186 lb (84.369 kg)  BMI 26.3 kg/m2  SpO2 99%  General: Well developed, well nourished, in no acute distress. Head:  Normocephalic and atraumatic. Neck: no JVD Lungs: Clear to auscultation and percussion.  Minimal expiratory prolongation.   Heart: Normal S1 and S2.  No murmur, rubs or gallops. . Extremities: No clubbing or cyanosis. No edema. Neurologic: Alert and oriented x 3.  EKG:  Not done  ASSESSMENT AND PLAN:

## 2012-03-05 NOTE — Assessment & Plan Note (Signed)
The patient's blood pressure seems reasonably controlled.

## 2012-03-05 NOTE — Assessment & Plan Note (Signed)
It is appropriate time to obtain a lipid liver profile

## 2012-03-05 NOTE — Assessment & Plan Note (Signed)
The patient continues to get along well. His been treated medically since the time of his initial presentation. He's not having any current ischemic symptoms. He will be seen back in followup by one of my colleagues. His overall left ventricular function is preserved. He does have residual circumflex disease, medically treated

## 2012-03-12 ENCOUNTER — Other Ambulatory Visit (INDEPENDENT_AMBULATORY_CARE_PROVIDER_SITE_OTHER): Payer: Medicare Other

## 2012-03-12 DIAGNOSIS — E7800 Pure hypercholesterolemia, unspecified: Secondary | ICD-10-CM

## 2012-03-12 LAB — LIPID PANEL
HDL: 38.2 mg/dL — ABNORMAL LOW (ref >39.00)
Triglycerides: 74 mg/dL (ref 0.0–149.0)

## 2012-03-12 LAB — HEPATIC FUNCTION PANEL
ALT: 17 U/L (ref 0–53)
Albumin: 3.7 g/dL (ref 3.5–5.2)
Total Protein: 7 g/dL (ref 6.0–8.3)

## 2012-03-26 ENCOUNTER — Ambulatory Visit: Payer: Medicare Other | Admitting: Internal Medicine

## 2012-05-15 ENCOUNTER — Other Ambulatory Visit: Payer: Self-pay | Admitting: *Deleted

## 2012-05-15 MED ORDER — ROSUVASTATIN CALCIUM 20 MG PO TABS: 20.0000 mg | ORAL_TABLET | Freq: Every day | ORAL | Status: DC

## 2012-07-16 ENCOUNTER — Ambulatory Visit: Payer: Medicare Other | Admitting: Internal Medicine

## 2012-09-13 ENCOUNTER — Encounter: Payer: Self-pay | Admitting: Cardiovascular Disease

## 2012-09-13 ENCOUNTER — Ambulatory Visit (INDEPENDENT_AMBULATORY_CARE_PROVIDER_SITE_OTHER): Payer: Medicare Other | Admitting: Cardiovascular Disease

## 2012-09-13 VITALS — BP 130/66 | HR 70 | Ht 70.0 in | Wt 181.0 lb

## 2012-09-13 DIAGNOSIS — I10 Essential (primary) hypertension: Secondary | ICD-10-CM

## 2012-09-13 DIAGNOSIS — E7800 Pure hypercholesterolemia, unspecified: Secondary | ICD-10-CM

## 2012-09-13 DIAGNOSIS — I2510 Atherosclerotic heart disease of native coronary artery without angina pectoris: Secondary | ICD-10-CM

## 2012-09-13 NOTE — Progress Notes (Signed)
   HPI:  72 year old gentleman presenting for followup evaluation. The patient has been followed by Dr. Riley Kill. He initially presented in November 2012 with an inferior wall infarct. He was found to have severe stenosis in the posterolateral branch of the right coronary artery treated with a drug-eluting stent. He had some moderate residual disease in the left circumflex treated medically.his post MI echocardiogram showed left ventricular ejection fraction of 50-55%.  Doing really well at the present time. He's very active and has no symptoms with exertion. He specifically denies chest pain, chest pressure, dyspnea, or edema. He complains of some pain in his forearms into his hands that occurs intermittently. He does a lot of work with his hands.  Outpatient Encounter Prescriptions as of 09/13/2012  Medication Sig Dispense Refill  . aspirin 81 MG tablet Take 81 mg by mouth daily.      . metoprolol (LOPRESSOR) 50 MG tablet Take 1 tablet (50 mg total) by mouth 2 (two) times daily.  60 tablet  12  . nitroGLYCERIN (NITROSTAT) 0.4 MG SL tablet Place 1 tablet (0.4 mg total) under the tongue every 5 (five) minutes as needed for chest pain.  25 tablet  11  . rosuvastatin (CRESTOR) 20 MG tablet Take 1 tablet (20 mg total) by mouth daily.  30 tablet  6   No facility-administered encounter medications on file as of 09/13/2012.    Allergies  Allergen Reactions  . Tylenol [Acetaminophen] Shortness Of Breath    Past Medical History  Diagnosis Date  . ST elevation myocardial infarction (STEMI) of inferior wall     12/12/10 - tx with Promus DES to the PL artery  . CAD (coronary artery disease)     LHC 12/12/10: oD1 70% (small), pRI 70-75%, AV CFX 75%, mRCA 40%, dRCA 30%, PLA 95% tx with DES, EF 55%, inf HK  . Headache(784.0)   . Peripheral vascular disease   . Arthritis     ROS: Negative except as per HPI  BP 130/66  Pulse 70  Ht 5\' 10"  (1.778 m)  Wt 181 lb (82.101 kg)  BMI 25.97 kg/m2  SpO2  94%  PHYSICAL EXAM: Pt is alert and oriented, NAD HEENT: normal Neck: JVP - normal, carotids 2+= without bruits Lungs: CTA bilaterally CV: RRR without murmur or gallop Abd: soft, NT, Positive BS, no hepatomegaly Ext: no C/C/E, distal pulses intact and equal Skin: warm/dry no rash  EKG:  Sinus rhythm with PACs, heart rate 70 beats per minute, otherwise within normal limits.  ASSESSMENT AND PLAN: 1. Coronary artery disease, native vessel. Stable without anginal symptoms. Treated with aspirin, beta blocker, and a statin drug. Left ventricular ejection fraction is preserved.I'll see him back in 6 months.  2. Hypercholesterolemia.lipids from February showed cholesterol of 97, HDL 38, and LDL 44. Will continue current therapy and repeat lipids and LFTs in 6 months before his return visit.  3. Hypertension. Blood pressure is controlled on metoprolol.  Tonny Bollman 09/13/2012 3:43 PM

## 2012-09-13 NOTE — Patient Instructions (Addendum)
Your physician wants you to follow-up in: 6 MONTHS with Dr Excell Seltzer.  You will receive a reminder letter in the mail two months in advance. If you don't receive a letter, please call our office to schedule the follow-up appointment.  Your physician recommends that you return for a FASTING LIPID, LIVER and BMP in 6 MONTHS--nothing to eat or drink after midnight, lab opens at 7:30.   Your physician recommends that you continue on your current medications as directed. Please refer to the Current Medication list given to you today.

## 2012-12-17 ENCOUNTER — Other Ambulatory Visit: Payer: Self-pay

## 2012-12-17 MED ORDER — ROSUVASTATIN CALCIUM 20 MG PO TABS: 20.0000 mg | ORAL_TABLET | Freq: Every day | ORAL | Status: DC

## 2013-03-04 ENCOUNTER — Ambulatory Visit: Payer: Medicare Other | Admitting: Cardiovascular Disease

## 2013-03-04 ENCOUNTER — Telehealth: Payer: Self-pay | Admitting: Nurse Practitioner

## 2013-03-04 ENCOUNTER — Other Ambulatory Visit (INDEPENDENT_AMBULATORY_CARE_PROVIDER_SITE_OTHER): Payer: Medicare Other

## 2013-03-04 DIAGNOSIS — E7800 Pure hypercholesterolemia, unspecified: Secondary | ICD-10-CM

## 2013-03-04 DIAGNOSIS — I2510 Atherosclerotic heart disease of native coronary artery without angina pectoris: Secondary | ICD-10-CM

## 2013-03-04 DIAGNOSIS — R0602 Shortness of breath: Secondary | ICD-10-CM

## 2013-03-04 DIAGNOSIS — I10 Essential (primary) hypertension: Secondary | ICD-10-CM

## 2013-03-04 LAB — HEPATIC FUNCTION PANEL
ALBUMIN: 3.7 g/dL (ref 3.5–5.2)
ALK PHOS: 70 U/L (ref 39–117)
ALT: 18 U/L (ref 0–53)
AST: 21 U/L (ref 0–37)
Bilirubin, Direct: 0.1 mg/dL (ref 0.0–0.3)
TOTAL PROTEIN: 7.1 g/dL (ref 6.0–8.3)
Total Bilirubin: 0.8 mg/dL (ref 0.3–1.2)

## 2013-03-04 LAB — LIPID PANEL
CHOL/HDL RATIO: 2
Cholesterol: 88 mg/dL (ref 0–200)
HDL: 38.2 mg/dL — ABNORMAL LOW (ref >39.00)
LDL CALC: 41 mg/dL (ref 0–99)
TRIGLYCERIDES: 43 mg/dL (ref 0.0–149.0)
VLDL: 8.6 mg/dL (ref 0.0–40.0)

## 2013-03-04 LAB — BASIC METABOLIC PANEL
BUN: 12 mg/dL (ref 6–23)
CHLORIDE: 106 meq/L (ref 96–112)
CO2: 28 meq/L (ref 19–32)
CREATININE: 0.6 mg/dL (ref 0.4–1.5)
Calcium: 9.7 mg/dL (ref 8.4–10.5)
GFR: 130.32 mL/min (ref >60.00)
Glucose, Bld: 98 mg/dL (ref 70–99)
POTASSIUM: 3.9 meq/L (ref 3.5–5.1)
Sodium: 140 mEq/L (ref 135–145)

## 2013-03-04 MED ORDER — METOPROLOL TARTRATE 50 MG PO TABS: 50.0000 mg | ORAL_TABLET | Freq: Two times a day (BID) | ORAL | Status: DC

## 2013-03-04 NOTE — Telephone Encounter (Signed)
Spoke with patient who had appointment for today (2/10 @ 3:30) and advised him that Dr. Burt Knack is in the hospital doing a procedure and will be running at least 1 hour behind.  I offered patient the opportunity to reschedule, which he did.  I also refilled patient's Metoprolol which he states he is out of.

## 2013-03-05 NOTE — Progress Notes (Signed)
Quick Note:  Preliminary report reviewed by triage nurse and sent to MD desk. ______ 

## 2013-03-14 ENCOUNTER — Ambulatory Visit: Payer: Medicare Other | Admitting: Cardiovascular Disease

## 2013-03-14 ENCOUNTER — Encounter: Payer: Self-pay | Admitting: Cardiovascular Disease

## 2013-03-14 ENCOUNTER — Ambulatory Visit (INDEPENDENT_AMBULATORY_CARE_PROVIDER_SITE_OTHER): Payer: Medicare Other | Admitting: Cardiovascular Disease

## 2013-03-14 VITALS — BP 150/82 | HR 55 | Ht 70.0 in | Wt 184.0 lb

## 2013-03-14 DIAGNOSIS — I2510 Atherosclerotic heart disease of native coronary artery without angina pectoris: Secondary | ICD-10-CM

## 2013-03-14 DIAGNOSIS — E7800 Pure hypercholesterolemia, unspecified: Secondary | ICD-10-CM

## 2013-03-14 DIAGNOSIS — I10 Essential (primary) hypertension: Secondary | ICD-10-CM

## 2013-03-14 MED ORDER — ROSUVASTATIN CALCIUM 10 MG PO TABS: 10.0000 mg | ORAL_TABLET | Freq: Every day | ORAL | Status: DC

## 2013-03-14 NOTE — Patient Instructions (Addendum)
Your physician has recommended you make the following change in your medication:  DECREASE Crestor to 10 mg Daily  Your physician recommends that you return for lab work in: 1 year about one week prior to your appointment with Dr. Burt Knack (cholesterol/liver panel) You will need to fast for this appointment (nothing to eat or drink after midnight)  Your physician wants you to follow-up in: 1 year with Dr. Burt Knack.  You will receive a reminder letter in the mail two months in advance. If you don't receive a letter, please call our office to schedule the follow-up appointment.  Your physician has requested that you regularly monitor and record your blood pressure readings at home. Please use the same machine at the same time of day to check your readings twice daily for 2 weeks and call Christen Bame, RN to report.  Please call before the end of 2 weeks if BP is consistently greater than 140/90

## 2013-03-14 NOTE — Progress Notes (Signed)
    HPI:  73 year old gentleman presenting for followup evaluation. The patient has coronary artery disease. He presented in 2012 with an inferior wall infarction and was found to have severe stenosis in a posterolateral branch of the RCA. This was treated with a drug-eluting stent. He had moderate disease in the left circumflex and medical therapy was recommended. His post MI LVEF was 50-55%. Recent lipids showed a cholesterol of 88, triglycerides 43, HDL 38, and LDL 41. His metabolic panel and liver function tests were within normal limits. Glucose was 98.  The patient continues to do well. He remains physically active. He denies chest pain, chest pressure, shortness of breath, edema, or palpitations. He is compliant with his medications. He has no complaints today.  Outpatient Encounter Prescriptions as of 03/14/2013  Medication Sig  . aspirin 81 MG tablet Take 81 mg by mouth daily.  . metoprolol (LOPRESSOR) 50 MG tablet Take 1 tablet (50 mg total) by mouth 2 (two) times daily.  . nitroGLYCERIN (NITROSTAT) 0.4 MG SL tablet Place 1 tablet (0.4 mg total) under the tongue every 5 (five) minutes as needed for chest pain.  . rosuvastatin (CRESTOR) 20 MG tablet Take 1 tablet (20 mg total) by mouth daily.    Allergies  Allergen Reactions  . Tylenol [Acetaminophen] Shortness Of Breath    Past Medical History  Diagnosis Date  . ST elevation myocardial infarction (STEMI) of inferior wall     12/12/10 - tx with Promus DES to the PL artery  . CAD (coronary artery disease)     LHC 12/12/10: oD1 70% (small), pRI 70-75%, AV CFX 75%, mRCA 40%, dRCA 30%, PLA 95% tx with DES, EF 55%, inf HK  . Headache(784.0)   . Peripheral vascular disease   . Arthritis     ROS: Negative except as per HPI  BP 150/82  Pulse 55  Ht 5\' 10"  (1.778 m)  Wt 184 lb (83.462 kg)  BMI 26.40 kg/m2  PHYSICAL EXAM: Pt is alert and oriented, NAD HEENT: normal Neck: JVP - normal, carotids 2+= without bruits Lungs: CTA  bilaterally CV: RRR without murmur or gallop Abd: soft, NT, Positive BS, no hepatomegaly Ext: no C/C/E, distal pulses intact and equal Skin: warm/dry no rash  EKG:  Sinus bradycardia 55 beats per minute, otherwise within normal limits.  ASSESSMENT AND PLAN: 1. Coronary artery disease, native vessel. The patient is stable without symptoms of angina. He is tolerating aspirin, beta blocker, and a statin drug without side effects. He'll return in 12 months.  2. Hypercholesterolemia. His cholesterol is extremely low. I'm going to reduce his Crestor to 10 mg daily. Will repeat lipids and LFTs in one year before he returns in followup.  3. Hypertension. Home blood pressure has been running in the 120s. He's going to get a new cuff because he's not sure about the accuracy of his home cuff. We gave him parameters to call back in a few weeks if his blood pressure is greater than 140/90 and will consider addition of another antihypertensive drug if needed.  For followup I will see him back in one year with repeat lipids and LFTs prior to that visit.  Sherren Mocha 03/14/2013 9:14 AM

## 2013-03-19 ENCOUNTER — Telehealth: Payer: Self-pay | Admitting: Cardiovascular Disease

## 2013-03-19 DIAGNOSIS — I10 Essential (primary) hypertension: Secondary | ICD-10-CM

## 2013-03-19 NOTE — Telephone Encounter (Signed)
New message    Patient calling  was told to call back speak with nurse.

## 2013-03-19 NOTE — Telephone Encounter (Signed)
Spoke with patient is calling to report BP per our request.  Patient reports: Monday 2/23 @ 0452 145/77; @ 1900 131/70 Tuesday 2/24 @ 0453 142/77; @ 1900 118/65 Wednesday 2/25 @ 0430 180/78; @ 1400 150/80 I advised patient that Dr. Burt Knack is out of the office until Monday and that I will report readings to him.  Patient denies complaints or symptoms; states he had a headache one day recently but he does not know whether it was associated with elevated BP. I advised patient to continue to monitor his BP and if he consistently gets readings greater than or equal to 150/90 to call the office; if the office is closed due to weather, that patient will be directed to the on-call provider.  I advised that otherwise, I will call patient Monday with Dr. Antionette Char advice.  Patient verbalized understanding and agreement.

## 2013-03-24 MED ORDER — HYDROCHLOROTHIAZIDE 25 MG PO TABS: 25.0000 mg | ORAL_TABLET | Freq: Every day | ORAL | Status: DC

## 2013-03-24 NOTE — Telephone Encounter (Signed)
Review blood pressure readings. Recommend add hydrochlorothiazide 25 mg daily. Check a metabolic panel in 2 weeks. Thanks.

## 2013-03-24 NOTE — Telephone Encounter (Signed)
Left message on patient's personal VM regarding addition of HCTZ 25 mg once daily; Rx to [facility] on [address].  I advised patient that lab work needs to be done in 2 weeks and that I am scheduling lab appointment for 3/17.  I advised patient that I will be out of office tomorrow and that if he has questions he may call the office tomorrow and ask for the triage nurse or he can call me on Wednesday.  Orders in epic

## 2013-04-08 ENCOUNTER — Other Ambulatory Visit: Payer: Medicare Other

## 2013-04-09 ENCOUNTER — Other Ambulatory Visit (INDEPENDENT_AMBULATORY_CARE_PROVIDER_SITE_OTHER): Payer: Medicare Other

## 2013-04-09 ENCOUNTER — Other Ambulatory Visit: Payer: Self-pay

## 2013-04-09 DIAGNOSIS — E7800 Pure hypercholesterolemia, unspecified: Secondary | ICD-10-CM

## 2013-04-09 DIAGNOSIS — E876 Hypokalemia: Secondary | ICD-10-CM

## 2013-04-09 DIAGNOSIS — I10 Essential (primary) hypertension: Secondary | ICD-10-CM

## 2013-04-09 LAB — HEPATIC FUNCTION PANEL
ALBUMIN: 3.7 g/dL (ref 3.5–5.2)
ALK PHOS: 73 U/L (ref 39–117)
ALT: 16 U/L (ref 0–53)
AST: 20 U/L (ref 0–37)
BILIRUBIN DIRECT: 0.1 mg/dL (ref 0.0–0.3)
Total Bilirubin: 0.8 mg/dL (ref 0.3–1.2)
Total Protein: 7.5 g/dL (ref 6.0–8.3)

## 2013-04-09 LAB — BASIC METABOLIC PANEL
BUN: 10 mg/dL (ref 6–23)
CO2: 31 meq/L (ref 19–32)
CREATININE: 0.7 mg/dL (ref 0.4–1.5)
Calcium: 10.2 mg/dL (ref 8.4–10.5)
Chloride: 99 mEq/L (ref 96–112)
GFR: 111.93 mL/min (ref >60.00)
Glucose, Bld: 105 mg/dL — ABNORMAL HIGH (ref 70–99)
Potassium: 3.4 mEq/L — ABNORMAL LOW (ref 3.5–5.1)
Sodium: 138 mEq/L (ref 135–145)

## 2013-04-09 LAB — LIPID PANEL
CHOL/HDL RATIO: 3
Cholesterol: 94 mg/dL (ref 0–200)
HDL: 35.1 mg/dL — ABNORMAL LOW (ref >39.00)
LDL Cholesterol: 46 mg/dL (ref 0–99)
TRIGLYCERIDES: 65 mg/dL (ref 0.0–149.0)
VLDL: 13 mg/dL (ref 0.0–40.0)

## 2013-04-09 MED ORDER — POTASSIUM CHLORIDE CRYS ER 20 MEQ PO TBCR: EXTENDED_RELEASE_TABLET | ORAL | Status: DC

## 2013-04-23 ENCOUNTER — Other Ambulatory Visit (INDEPENDENT_AMBULATORY_CARE_PROVIDER_SITE_OTHER): Payer: Medicare Other

## 2013-04-23 DIAGNOSIS — E876 Hypokalemia: Secondary | ICD-10-CM

## 2013-04-23 LAB — BASIC METABOLIC PANEL
BUN: 13 mg/dL (ref 6–23)
CO2: 30 mEq/L (ref 19–32)
CREATININE: 0.8 mg/dL (ref 0.4–1.5)
Calcium: 10.1 mg/dL (ref 8.4–10.5)
Chloride: 100 mEq/L (ref 96–112)
GFR: 99.27 mL/min (ref >60.00)
GLUCOSE: 98 mg/dL (ref 70–99)
POTASSIUM: 3.5 meq/L (ref 3.5–5.1)
Sodium: 136 mEq/L (ref 135–145)

## 2013-10-20 ENCOUNTER — Other Ambulatory Visit: Payer: Self-pay | Admitting: Cardiovascular Disease

## 2013-11-03 ENCOUNTER — Other Ambulatory Visit: Payer: Medicare Other

## 2013-11-06 ENCOUNTER — Other Ambulatory Visit (INDEPENDENT_AMBULATORY_CARE_PROVIDER_SITE_OTHER): Payer: Medicare Other | Admitting: *Deleted

## 2013-11-06 DIAGNOSIS — E7800 Pure hypercholesterolemia, unspecified: Secondary | ICD-10-CM

## 2013-11-06 DIAGNOSIS — I10 Essential (primary) hypertension: Secondary | ICD-10-CM

## 2013-11-06 LAB — BASIC METABOLIC PANEL
BUN: 13 mg/dL (ref 6–23)
CALCIUM: 10.4 mg/dL (ref 8.4–10.5)
CO2: 32 meq/L (ref 19–32)
Chloride: 100 mEq/L (ref 96–112)
Creatinine, Ser: 0.9 mg/dL (ref 0.4–1.5)
GFR: 93.75 mL/min (ref >60.00)
Glucose, Bld: 101 mg/dL — ABNORMAL HIGH (ref 70–99)
Potassium: 3.7 mEq/L (ref 3.5–5.1)
SODIUM: 137 meq/L (ref 135–145)

## 2013-12-31 ENCOUNTER — Encounter (HOSPITAL_COMMUNITY): Payer: Self-pay | Admitting: Cardiology

## 2014-02-17 ENCOUNTER — Other Ambulatory Visit: Payer: Self-pay | Admitting: Cardiovascular Disease

## 2014-03-04 ENCOUNTER — Other Ambulatory Visit (INDEPENDENT_AMBULATORY_CARE_PROVIDER_SITE_OTHER): Payer: Medicare Other | Admitting: *Deleted

## 2014-03-04 DIAGNOSIS — I10 Essential (primary) hypertension: Secondary | ICD-10-CM

## 2014-03-04 LAB — BASIC METABOLIC PANEL
BUN: 13 mg/dL (ref 6–23)
CO2: 31 mEq/L (ref 19–32)
Calcium: 10.3 mg/dL (ref 8.4–10.5)
Chloride: 103 mEq/L (ref 96–112)
Creatinine, Ser: 0.81 mg/dL (ref 0.40–1.50)
GFR: 99.03 mL/min (ref >60.00)
Glucose, Bld: 103 mg/dL — ABNORMAL HIGH (ref 70–99)
POTASSIUM: 3.7 meq/L (ref 3.5–5.1)
Sodium: 138 mEq/L (ref 135–145)

## 2014-03-04 NOTE — Addendum Note (Signed)
Addended by: Eulis Foster on: 03/04/2014 07:43 AM   Modules accepted: Orders

## 2014-03-09 ENCOUNTER — Telehealth: Payer: Self-pay | Admitting: *Deleted

## 2014-03-09 MED ORDER — NITROGLYCERIN 0.4 MG SL SUBL
0.4000 mg | SUBLINGUAL_TABLET | SUBLINGUAL | Status: DC | PRN
Start: 2014-03-09 — End: 2014-03-10

## 2014-03-09 NOTE — Telephone Encounter (Signed)
both pt and his wife have been notified of lab results with verbal understanding. I stated I saw NTG was expired did she want me to send in new Rx for NTG, wife said yes, new Rx for ntg sent in.

## 2014-03-10 ENCOUNTER — Encounter: Payer: Self-pay | Admitting: Cardiovascular Disease

## 2014-03-10 ENCOUNTER — Ambulatory Visit (INDEPENDENT_AMBULATORY_CARE_PROVIDER_SITE_OTHER): Payer: Medicare Other | Admitting: Cardiovascular Disease

## 2014-03-10 VITALS — BP 138/68 | HR 67 | Ht 70.0 in | Wt 177.8 lb

## 2014-03-10 DIAGNOSIS — E7800 Pure hypercholesterolemia, unspecified: Secondary | ICD-10-CM

## 2014-03-10 DIAGNOSIS — I10 Essential (primary) hypertension: Secondary | ICD-10-CM

## 2014-03-10 DIAGNOSIS — I2510 Atherosclerotic heart disease of native coronary artery without angina pectoris: Secondary | ICD-10-CM

## 2014-03-10 DIAGNOSIS — R0602 Shortness of breath: Secondary | ICD-10-CM

## 2014-03-10 MED ORDER — ROSUVASTATIN CALCIUM 20 MG PO TABS: 20.0000 mg | ORAL_TABLET | Freq: Every day | ORAL | Status: DC

## 2014-03-10 MED ORDER — NITROGLYCERIN 0.4 MG SL SUBL
0.4000 mg | SUBLINGUAL_TABLET | SUBLINGUAL | Status: AC | PRN
Start: 2014-03-10 — End: 2016-06-07

## 2014-03-10 MED ORDER — POTASSIUM CHLORIDE CRYS ER 20 MEQ PO TBCR: 20.0000 meq | EXTENDED_RELEASE_TABLET | Freq: Every day | ORAL | Status: DC

## 2014-03-10 MED ORDER — HYDROCHLOROTHIAZIDE 25 MG PO TABS: 25.0000 mg | ORAL_TABLET | Freq: Every day | ORAL | Status: DC

## 2014-03-10 MED ORDER — METOPROLOL TARTRATE 50 MG PO TABS: 50.0000 mg | ORAL_TABLET | Freq: Two times a day (BID) | ORAL | Status: DC

## 2014-03-10 NOTE — Patient Instructions (Signed)
Your physician recommends that you return for a FASTING LIPID and LIVER profile--nothing to eat or drink after midnight, lab opens at 7:30 AM  Your physician wants you to follow-up in: 1 YEAR with Dr Burt Knack.  You will receive a reminder letter in the mail two months in advance. If you don't receive a letter, please call our office to schedule the follow-up appointment.  Your physician recommends that you continue on your current medications as directed. Please refer to the Current Medication list given to you today.

## 2014-03-10 NOTE — Progress Notes (Signed)
Cardiology Office Note   Date:  03/10/2014   ID:  Charles Luna Ruda, DOB November 01, 1940, MRN [id number]  PCP:  Pcp Not In System  Cardiologist:  Sherren Mocha, MD    Chief Complaint  Patient presents with  . Medication Refill    both arms and hands hurting    History of Present Illness: Charles Luna Gloss is a 74 y.o. male who presents for follow-up of coronary artery disease. He presented in 2012 with an inferior wall infarction and was found to have severe stenosis in a posterolateral branch of the RCA. This was treated with a drug-eluting stent. He had moderate disease in the left circumflex and medical therapy was recommended. His post MI LVEF was 50-55%.   The patient is doing well. He hasn't been as active this winter, but otherwise has remained physically active with no exertional symptoms. He denies chest pain, chest pressure, shortness of breath, leg swelling, or heart palpitations. He has had some discomfort in his forearms radiating into his hands. This is unrelated to aerobic activity or physical exertion. Sometimes it is exacerbated by use of his hands. It is not similar to his previous anginal symptoms which felt more like arm heaviness.   Past Medical History  Diagnosis Date  . ST elevation myocardial infarction (STEMI) of inferior wall     12/12/10 - tx with Promus DES to the PL artery  . CAD (coronary artery disease)     LHC 12/12/10: oD1 70% (small), pRI 70-75%, AV CFX 75%, mRCA 40%, dRCA 30%, PLA 95% tx with DES, EF 55%, inf HK  . Headache(784.0)   . Peripheral vascular disease   . Arthritis     Past Surgical History  Procedure Laterality Date  . Hemorroidectomy    . Left heart catheterization with coronary angiogram N/A 12/12/2010    Procedure: LEFT HEART CATHETERIZATION WITH CORONARY ANGIOGRAM;  Surgeon: Hillary Bow, MD;  Location[facility] LAB;  Service: Cardiovascular;  Laterality: N/A;  . Percutaneous coronary stent intervention (pci-s) N/A 12/12/2010    Procedure:  PERCUTANEOUS CORONARY STENT INTERVENTION (PCI-S);  Surgeon: Hillary Bow, MD;  Location: [facility] CATH LAB;  Service: Cardiovascular;  Laterality: N/A;    Current Outpatient Prescriptions  Medication Sig Dispense Refill  . aspirin 81 MG tablet Take 81 mg by mouth daily.    . hydrochlorothiazide (HYDRODIURIL) 25 MG tablet take 1 tablet by mouth once daily 30 tablet 1  . metoprolol (LOPRESSOR) 50 MG tablet Take 1 tablet (50 mg total) by mouth 2 (two) times daily. 60 tablet 12  . nitroGLYCERIN (NITROSTAT) 0.4 MG SL tablet Place 1 tablet (0.4 mg total) under the tongue every 5 (five) minutes as needed for chest pain. 25 tablet 3  . potassium chloride SA (K-DUR,KLOR-CON) 20 MEQ tablet Take 2 tablets daily for 3 days then take 1 tablet daily 60 tablet 6  . rosuvastatin (CRESTOR) 20 MG tablet Take 20 mg by mouth daily.     No current facility-administered medications for this visit.    Allergies:   Tylenol   Social History:  The patient  reports that he quit smoking about 23 years ago. His smoking use included Cigarettes. He quit after 20 years of use. He does not have any smokeless tobacco history on file. He reports that he does not drink alcohol or use illicit drugs.   Family History:  The patient's family history includes Cancer in his mother.   ROS:  Please see the history of present illness.  Otherwise,  review of systems is positive for bilateral hand and forearm pain.  Also positive for leg pain, chronic and unchanged.  All other systems are reviewed and negative.   PHYSICAL EXAM: VS:  BP 138/68 mmHg  Pulse 67  Ht 5\' 10"  (1.778 m)  Wt 177 lb 12.8 oz (80.65 kg)  BMI 25.51 kg/m2 , BMI Body mass index is 25.51 kg/(m^2). GEN: Well nourished, well developed, in no acute distress HEENT: normal Neck: no JVD, carotid bruits, or masses Cardiac: RRR without murmur or gallop                No peripheral edema Respiratory:  clear to auscultation bilaterally, normal work of breathing GI: soft,  nontender, nondistended, + BS MS: no deformity or atrophy Skin: warm and dry, no rash Neuro:  Strength and sensation are intact Psych: euthymic mood, full affect  EKG:  EKG is ordered today. The ekg ordered today shows normal sinus rhythm 67 bpm, within normal limits.  Recent Labs: 04/09/2013: ALT 16 03/04/2014: BUN 13; Creatinine 0.81; Potassium 3.7; Sodium 138   Lipid Panel     Component Value Date/Time   CHOL 94 04/09/2013 0739   TRIG 65.0 04/09/2013 0739   HDL 35.10* 04/09/2013 0739   CHOLHDL 3 04/09/2013 0739   VLDL 13.0 04/09/2013 0739   LDLCALC 46 04/09/2013 0739      Wt Readings from Last 3 Encounters:  03/10/14 177 lb 12.8 oz (80.65 kg)  03/14/13 184 lb (83.462 kg)  09/13/12 181 lb (82.101 kg)     Cardiac Studies Reviewed: 2D Echo 12.13.2012: Study Conclusions  - Left ventricle: Distal septal hypokinesis The cavity size was normal. Wall thickness was normal. Systolic function was normal. The estimated ejection fraction was in the range of 50% to 55%. - Atrial septum: No defect or patent foramen ovale was identified.  ASSESSMENT AND PLAN: 1.  Coronary artery disease, native vessel: stable without symptoms of angina. Continue ASA, statin drug, and metoprolol. Will see back in one year.   2. Hypercholesterolemia: needs repeat lipids and LFT's. Discussed lifestyle modification. Continue crestor 10 mg daily.  3. Essential hypertension: BP stable on current Rx. Requires K-Dur to keep potassium replete. Reports home BP's in the 120 range.  Current medicines are reviewed with the patient today.  The patient does not have concerns regarding medicines.  The following changes have been made:  no change  Labs/ tests ordered today include:  No orders of the defined types were placed in this encounter.    Disposition:   FU with me in one year  Signed, Sherren Mocha, MD  03/10/2014 3:10 PM    [facility] [address],  [place], [place]  [address] Phone: [phone]; Fax: [phone]

## 2014-03-12 ENCOUNTER — Other Ambulatory Visit (INDEPENDENT_AMBULATORY_CARE_PROVIDER_SITE_OTHER): Payer: Medicare Other | Admitting: *Deleted

## 2014-03-12 DIAGNOSIS — E7800 Pure hypercholesterolemia, unspecified: Secondary | ICD-10-CM

## 2014-03-12 LAB — HEPATIC FUNCTION PANEL
ALK PHOS: 70 U/L (ref 39–117)
ALT: 15 U/L (ref 0–53)
AST: 19 U/L (ref 0–37)
Albumin: 3.7 g/dL (ref 3.5–5.2)
BILIRUBIN DIRECT: 0.3 mg/dL (ref 0.0–0.3)
Total Bilirubin: 0.9 mg/dL (ref 0.2–1.2)
Total Protein: 7.1 g/dL (ref 6.0–8.3)

## 2014-03-12 LAB — LIPID PANEL
CHOL/HDL RATIO: 3
CHOLESTEROL: 101 mg/dL (ref 0–200)
HDL: 36.4 mg/dL — ABNORMAL LOW (ref >39.00)
LDL Cholesterol: 49 mg/dL (ref 0–99)
NONHDL: 64.6
TRIGLYCERIDES: 76 mg/dL (ref 0.0–149.0)
VLDL: 15.2 mg/dL (ref 0.0–40.0)

## 2014-03-20 ENCOUNTER — Other Ambulatory Visit: Payer: Self-pay | Admitting: Cardiovascular Disease

## 2014-03-23 ENCOUNTER — Telehealth: Payer: Self-pay | Admitting: Cardiovascular Disease

## 2014-03-23 DIAGNOSIS — R0602 Shortness of breath: Secondary | ICD-10-CM

## 2014-03-23 DIAGNOSIS — I2510 Atherosclerotic heart disease of native coronary artery without angina pectoris: Secondary | ICD-10-CM

## 2014-03-23 DIAGNOSIS — E7800 Pure hypercholesterolemia, unspecified: Secondary | ICD-10-CM

## 2014-03-23 DIAGNOSIS — I10 Essential (primary) hypertension: Secondary | ICD-10-CM

## 2014-03-23 MED ORDER — ROSUVASTATIN CALCIUM 10 MG PO TABS: 10.0000 mg | ORAL_TABLET | Freq: Every day | ORAL | Status: DC

## 2014-03-23 NOTE — Telephone Encounter (Signed)
I spoke with the pt's wife and she said that the pt is taking Crestor 10mg  daily.  I made her aware that we had documented Crestor 20mg  based on the list the pt provided during 03/10/14 office visit.  She said that she had noticed she forgot to change the medicine list the pt carries in his wallet.  She asked that I send a new Rx to the pharmacy for Crestor 10mg  daily (30 day supply with refills).  Rx sent.

## 2014-03-23 NOTE — Telephone Encounter (Signed)
New message     Pt c/o medication issue:  1. Name of Medication: crestor  2. How are you currently taking this medication (dosage and times per day)? Pt has been taking 10mg  but 20mg  was called in 3. Are you having a reaction (difficulty breathing--STAT)?  4. What is your medication issue? Pt recently had labs drawn--doctor told pt labs looked good.  Did Dr Johnsie Cancel intend to call in 20mg  of crestor instead of 10mg ?  Please advise

## 2014-03-27 ENCOUNTER — Ambulatory Visit: Payer: Medicare Other | Admitting: Cardiovascular Disease

## 2015-01-11 ENCOUNTER — Telehealth: Payer: Self-pay | Admitting: Cardiovascular Disease

## 2015-01-11 NOTE — Telephone Encounter (Signed)
New Message  Pt calling to speak w/ RN- pt wife stated that she is noticing signs of a stroke and requested to speak w/ RN. Please call back and discuss.

## 2015-01-11 NOTE — Telephone Encounter (Signed)
I spoke with the pt and his wife and they are currently in [place] at [facility]. The weekend of 12/10 the pt complained of blurred vision and he went to the eye doctor on the 14th and everything was "okay". They also scanned behind his eye and this test was normal. The pt arrived in [place] on Saturday and his wife has noticed that he is dropping things.  The pt does describe numbness and weakness in right hand.  Yesterday the pt had to get a wheelchair due to weakness.  I advised the pt that he should proceed to the closest ER and he agreed with plan.

## 2015-01-12 ENCOUNTER — Telehealth: Payer: Self-pay | Admitting: Cardiovascular Disease

## 2015-01-12 NOTE — Telephone Encounter (Signed)
I spoke with the pt's wife and she said the pt has lesions on his brain.  The pt is undergoing further testing to locate the source of cancer.  The pt is currently in [facility]. The pt's wife said the pt may be released at the end of the week but will need immediate evaluation by oncology in [place].  I made her aware of the [place] at [facility] and we can facilitate getting the pt seen if his MD in [place] has issues transferring his care.  Mrs Both appreciated my help and plans to contact our office with an update when the pt is discharged.

## 2015-01-12 NOTE — Telephone Encounter (Signed)
New message      Talk to the nurse.  Pt is in the hosp in [place].  Wife want to give update to nurse

## 2015-01-14 ENCOUNTER — Telehealth: Payer: Self-pay | Admitting: Cardiovascular Disease

## 2015-01-14 DIAGNOSIS — G939 Disorder of brain, unspecified: Secondary | ICD-10-CM

## 2015-01-14 NOTE — Telephone Encounter (Signed)
Spoke with wife who is reporting pt is still in the hospital in [place] but should be d/ced by [employer].  Order placed referring pt to Oncology (he doesn't have a PCP) and for wife to be contacted to schedule the appt.   Will forward to Dr Burt Knack and Ander Purpura for their knowledge.

## 2015-01-14 NOTE — Telephone Encounter (Signed)
New message     Wife calling wants the nurse to called regarding referral to oncology

## 2015-01-19 NOTE — Telephone Encounter (Signed)
Per staff message from oncology:  Charles Luna,   Spoke with patient wife with on last week decided to see another provider. I did call patient today after seeing staff message per [name] wants husband to see Dr. Marin Olp referral routed to [facility] for review, informed [name] will call [facility] to get medical records faxed over for Dr. Marin Olp to review.  [phone]  Thanks  Tiffany

## 2015-01-27 ENCOUNTER — Ambulatory Visit (HOSPITAL_BASED_OUTPATIENT_CLINIC_OR_DEPARTMENT_OTHER): Payer: Medicare Other | Admitting: Hematology & Oncology

## 2015-01-27 ENCOUNTER — Encounter: Payer: Self-pay | Admitting: Hematology & Oncology

## 2015-01-27 ENCOUNTER — Ambulatory Visit: Payer: Medicare Other

## 2015-01-27 ENCOUNTER — Other Ambulatory Visit (HOSPITAL_BASED_OUTPATIENT_CLINIC_OR_DEPARTMENT_OTHER): Payer: Medicare Other

## 2015-01-27 ENCOUNTER — Other Ambulatory Visit: Payer: Self-pay | Admitting: Radiation Oncology

## 2015-01-27 VITALS — BP 168/73 | HR 62 | Temp 97.4°F | Resp 16 | Ht 70.0 in | Wt 158.0 lb

## 2015-01-27 DIAGNOSIS — C801 Malignant (primary) neoplasm, unspecified: Secondary | ICD-10-CM

## 2015-01-27 DIAGNOSIS — C3411 Malignant neoplasm of upper lobe, right bronchus or lung: Secondary | ICD-10-CM

## 2015-01-27 DIAGNOSIS — R937 Abnormal findings on diagnostic imaging of other parts of musculoskeletal system: Secondary | ICD-10-CM | POA: Diagnosis not present

## 2015-01-27 DIAGNOSIS — C7931 Secondary malignant neoplasm of brain: Secondary | ICD-10-CM

## 2015-01-27 DIAGNOSIS — C3490 Malignant neoplasm of unspecified part of unspecified bronchus or lung: Secondary | ICD-10-CM | POA: Insufficient documentation

## 2015-01-27 DIAGNOSIS — C3491 Malignant neoplasm of unspecified part of right bronchus or lung: Secondary | ICD-10-CM

## 2015-01-27 DIAGNOSIS — R930 Abnormal findings on diagnostic imaging of skull and head, not elsewhere classified: Secondary | ICD-10-CM

## 2015-01-27 LAB — LACTATE DEHYDROGENASE: LDH: 271 U/L — AB (ref 125–245)

## 2015-01-27 LAB — COMPREHENSIVE METABOLIC PANEL
ALT: 74 U/L — AB (ref 0–55)
AST: 35 U/L — AB (ref 5–34)
Albumin: 3 g/dL — ABNORMAL LOW (ref 3.5–5.0)
Alkaline Phosphatase: 111 U/L (ref 40–150)
Anion Gap: 9 mEq/L (ref 3–11)
BUN: 26.1 mg/dL — AB (ref 7.0–26.0)
CALCIUM: 10.3 mg/dL (ref 8.4–10.4)
CHLORIDE: 101 meq/L (ref 98–109)
CO2: 28 meq/L (ref 22–29)
CREATININE: 0.8 mg/dL (ref 0.7–1.3)
EGFR: 86 mL/min/{1.73_m2} — ABNORMAL LOW (ref >90)
Glucose: 119 mg/dl (ref 70–140)
POTASSIUM: 4.9 meq/L (ref 3.5–5.1)
SODIUM: 138 meq/L (ref 136–145)
Total Bilirubin: 0.96 mg/dL (ref 0.20–1.20)
Total Protein: 7 g/dL (ref 6.4–8.3)

## 2015-01-27 LAB — CBC WITH DIFFERENTIAL (CANCER CENTER ONLY)
BASO#: 0 10*3/uL (ref 0.0–0.2)
BASO%: 0.2 % (ref 0.0–2.0)
EOS%: 0 % (ref 0.0–7.0)
Eosinophils Absolute: 0 10*3/uL (ref 0.0–0.5)
HCT: 39 % (ref 38.7–49.9)
HGB: 13.1 g/dL (ref 13.0–17.1)
LYMPH#: 0.4 10*3/uL — AB (ref 0.9–3.3)
LYMPH%: 2.1 % — AB (ref 14.0–48.0)
MCH: 29.7 pg (ref 28.0–33.4)
MCHC: 33.6 g/dL (ref 32.0–35.9)
MCV: 88 fL (ref 82–98)
MONO#: 1.3 10*3/uL — ABNORMAL HIGH (ref 0.1–0.9)
MONO%: 6.9 % (ref 0.0–13.0)
NEUT#: 17.2 10*3/uL — ABNORMAL HIGH (ref 1.5–6.5)
NEUT%: 90.8 % — AB (ref 40.0–80.0)
PLATELETS: 233 10*3/uL (ref 145–400)
RBC: 4.41 10*6/uL (ref 4.20–5.70)
RDW: 14.3 % (ref 11.1–15.7)
WBC: 18.9 10*3/uL — ABNORMAL HIGH (ref 4.0–10.0)

## 2015-01-27 MED ORDER — DEXAMETHASONE 4 MG PO TABS: 4.0000 mg | ORAL_TABLET | Freq: Three times a day (TID) | ORAL | Status: DC

## 2015-01-27 NOTE — Progress Notes (Signed)
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 OTHER    New Patient  : I have lung cancer that has gone to my brain.  HPI: Mr. Holdman is a very nice 75 year old white male. He's been in good health. He does have a history of coronary artery disease. He's had a stent placed.  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 He has about a 50-pack-year history of tobacco use. He stopped 25 years ago.  He has family went down to [place] for vacation over the holidays. Prior to going down, began to have some blurred vision.  While down in [place], he had blurred vision and weakness in his right arm. He went to the emergency room. Unfortunately, he had a CT of the brain which showed multiple brain lesions. He then had an MRI done of the brain. The MRI showed at least 10 lesions that were spread throughout.  He did have a CT of the chest done. It was obvious that he had very good care down in [place]. The CT of the chest showed a right upper lobe mass measuring 4 x 2.5 x 3.8 cm. He had multiple ulnar nodules bilaterally. He had right hilar and mediastinal lymph nodes. He had no disease elsewhere in the body although there is some indeterminate lesions in the liver.  He then underwent a biopsy of this lung mass. This was done on December 23. The pathology report ([id number]) showed a poorly differentiated squamous cell carcinoma.  He was placed on some Decadron. He got better with his right arm weakness. He still has some blurred vision. His appetite improved.  He and his family came home. They can only spent 1 day at [facility].  He feels pretty good right now. The main complaint has been blurred vision.  His appetite is great. He's had no mouth sores. He's had no cough. He's had no hemoptysis. He's had no change in bowel or bladder habits. He says he's lost about  20 pounds and one month.  Overall, his performance status is ECOG 1.                        Past Medical History  Diagnosis Date  . ST elevation myocardial infarction (STEMI) of inferior wall ([place])     12/12/10 - tx with Promus DES to the PL artery  . CAD (coronary artery disease)     LHC 12/12/10: oD1 70% (small), pRI 70-75%, AV CFX 75%, mRCA 40%, dRCA 30%, PLA 95% tx with DES, EF 55%, inf HK  . Headache(784.0)   . Peripheral vascular disease ([place])   . Arthritis   . Metastasis to brain of unknown origin ([place]) 01/27/2015  . Lung cancer, primary, with metastasis from lung to other site [facility]) 01/27/2015  :  Past Surgical History  Procedure Laterality Date  . Hemorroidectomy    . Left heart catheterization with coronary angiogram N/A 12/12/2010    Procedure: LEFT HEART CATHETERIZATION WITH CORONARY ANGIOGRAM;  Surgeon: Hillary Bow, MD;  Location: [facility] CATH LAB;  Service: Cardiovascular;  Laterality: N/A;  . Percutaneous coronary stent intervention (pci-s) N/A 12/12/2010    Procedure: PERCUTANEOUS CORONARY STENT INTERVENTION (PCI-S);  Surgeon: Hillary Bow, MD;  Location: [facility] CATH LAB;  Service: Cardiovascular;  Laterality: N/A;  :   Current outpatient prescriptions:  .  aspirin 81  MG tablet, Take 81 mg by mouth daily., Disp: , Rfl:  .  dexamethasone (DECADRON) 4 MG tablet, Take 4 mg by mouth 3 (three) times daily., Disp: , Rfl:  .  hydrochlorothiazide (HYDRODIURIL) 25 MG tablet, Take 1 tablet (25 mg total) by mouth daily., Disp: 90 tablet, Rfl: 3 .  metoprolol (LOPRESSOR) 50 MG tablet, Take 1 tablet (50 mg total) by mouth 2 (two) times daily., Disp: 180 tablet, Rfl: 3 .  nitroGLYCERIN (NITROSTAT) 0.4 MG SL tablet, Place 1 tablet (0.4 mg total) under the tongue every 5 (five) minutes as needed for chest pain., Disp: 25 tablet, Rfl: 3 .  potassium chloride SA (K-DUR,KLOR-CON) 20 MEQ tablet, Take 1 tablet (20 mEq total) by mouth daily., Disp: 90 tablet, Rfl: 3 .   rosuvastatin (CRESTOR) 10 MG tablet, Take 1 tablet (10 mg total) by mouth daily., Disp: 30 tablet, Rfl: 11:  :  Allergies  Allergen Reactions  . Tylenol [Acetaminophen] Shortness Of Breath  :  Family History  Problem Relation Age of Onset  . Cancer Mother   :  Social History   Social History  . Marital Status: Married    Spouse Name: N/A  . Number of Children: N/A  . Years of Education: N/A   Occupational History  . Not on file.   Social History Main Topics  . Smoking status: Former Smoker -- 20 years    Types: Cigarettes    Quit date: 12/13/1990  . Smokeless tobacco: Not on file  . Alcohol Use: No  . Drug Use: No  . Sexual Activity: Yes   Other Topics Concern  . Not on file   Social History Narrative  :  Pertinent items are noted in HPI.  Exam: @IPVITALS @  well-developed and well-nourished white male in no obvious distress. Vital signs show temperature of 97.4. Pulse 60. Blood pressure 168/73. Weight is 158 pounds. Head and neck exam shows no ocular or oral lesions. He has no palpable cervical or supraclavicular lymph nodes. Lungs are clear bilaterally. He may have some slight decrease over the right lung field. Cardiac exam regular rate and rhythm with no murmurs, rubs or bruits. Abdomen is soft. He has good bowel sounds. There is no fluid wave. There is no palpable liver or spleen tip. Back exam shows no tenderness over the spine, ribs or hips. Extremities shows no clubbing, cyanosis or edema. He has good strength in upper and lower extremities. Neurological exam shows no focal neurological deficits.    Recent Labs  01/27/15 1152  WBC 18.9*  HGB 13.1  HCT 39.0  PLT 233   No results for input(s): NA, K, CL, CO2, GLUCOSE, BUN, CREATININE, CALCIUM in the last 72 hours.  Blood smear review:  None  Pathology: See above     Assessment and Plan:  Mr. Livingstone is a 75 year old white male. His past history of tobacco use and asbestos exposure. He now has  metastatic squamous cell carcinoma the lung.  I have spoken with Dr. Sondra Come of radiation oncology. He will get Mr. Claycomb this week to give radiation treatments. He is currently is on Decadron and we will keep him on Decadron.  We do need get a PET scan. I did this will be very helpful for [place]. I will get this set up in one week.  I will have to call down to [place]. I would like to think that he had additional studies done on the lung cancer. I think these can be vitally important for  [place] to see if he had PD-L1 analysis done. Typically, this is standard now.  As far as systemic therapy goes, this will be dictated by additional studies. If there is not enough tissue from what he had biopsied in [place], we may need to put him through another biopsy in order to obtain the appropriate studies.  I spent about an hour with he and his wife. They're very nice. I gave him a prayer blanket which he very much appreciated.  I will plan to see him back in 3 weeks. By then, should be done with radiation to the brain.  Given the amount of tumors in the brain, I would think that he would be a candidate for external beam radiation and not stereotactic radiation.Marland Kitchen

## 2015-01-28 ENCOUNTER — Ambulatory Visit: Admission: RE | Admit: 2015-01-28 | Payer: Medicare Other | Source: Ambulatory Visit | Admitting: Radiation Oncology

## 2015-01-28 ENCOUNTER — Other Ambulatory Visit: Payer: Self-pay | Admitting: *Deleted

## 2015-01-28 ENCOUNTER — Ambulatory Visit
Admission: RE | Admit: 2015-01-28 | Discharge: 2015-01-28 | Disposition: A | Discharge status: Home / Self Care | Payer: Medicare Other | Source: Ambulatory Visit | Attending: Radiation Oncology | Admitting: Radiation Oncology

## 2015-01-28 ENCOUNTER — Other Ambulatory Visit: Payer: Self-pay | Admitting: Radiation Therapy

## 2015-01-28 ENCOUNTER — Encounter: Payer: Self-pay | Admitting: Radiation Therapy

## 2015-01-28 ENCOUNTER — Encounter: Payer: Self-pay | Admitting: Radiation Oncology

## 2015-01-28 VITALS — BP 153/72 | HR 64 | Temp 97.6°F | Resp 16 | Ht 70.0 in | Wt 159.1 lb

## 2015-01-28 DIAGNOSIS — C7931 Secondary malignant neoplasm of brain: Secondary | ICD-10-CM

## 2015-01-28 DIAGNOSIS — Z510 Encounter for antineoplastic radiation therapy: Secondary | ICD-10-CM | POA: Insufficient documentation

## 2015-01-28 DIAGNOSIS — I252 Old myocardial infarction: Secondary | ICD-10-CM | POA: Insufficient documentation

## 2015-01-28 DIAGNOSIS — I739 Peripheral vascular disease, unspecified: Secondary | ICD-10-CM | POA: Insufficient documentation

## 2015-01-28 DIAGNOSIS — C801 Malignant (primary) neoplasm, unspecified: Principal | ICD-10-CM

## 2015-01-28 DIAGNOSIS — Z87891 Personal history of nicotine dependence: Secondary | ICD-10-CM | POA: Insufficient documentation

## 2015-01-28 DIAGNOSIS — C3490 Malignant neoplasm of unspecified part of unspecified bronchus or lung: Secondary | ICD-10-CM | POA: Insufficient documentation

## 2015-01-28 DIAGNOSIS — I2510 Atherosclerotic heart disease of native coronary artery without angina pectoris: Secondary | ICD-10-CM | POA: Diagnosis not present

## 2015-01-28 LAB — CEA: CEA: <0.5 ng/mL (ref 0.0–5.0)

## 2015-01-28 LAB — PREALBUMIN: PREALBUMIN: 24 mg/dL (ref 21–43)

## 2015-01-28 NOTE — Progress Notes (Signed)
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  Past or anticipated interventions, if any, per neurosurgery: no  Past or anticipated interventions, if any, per medical oncology: waiting for more test results to decide on chemotherapy.  Dose of Decadron, if applicable: 4 mg TID  Recent neurologic symptoms, if any:   Seizures: no  Headaches: mild - reports decadron is helping  Nausea: no  Dizziness/ataxia: yes  Difficulty with hand coordination: yes - reports having improvement since starting decadron  Focal numbness/weakness: yes - generalized weakness since he was hospitalized in December.  Visual deficits/changes: blurred vision  Confusion/Memory deficits: his wife has noticed he had confusion and memory problems.  Painful bone metastases at present, if any: no  SAFETY ISSUES:  Prior radiation? no  Pacemaker/ICD? no  Possible current pregnancy? no  Is the patient on methotrexate? no  Additional Complaints / other details: Patient is here with his wife.  He is an Clinical biochemist.  He will be having a PET scan on 02/01/14.  BP 153/72 mmHg  Pulse 64  Temp(Src) 97.6 F (36.4 C) (Oral)  Resp 16  Ht '5\' 10"'$  (1.778 m)  Wt 159 lb 1.6 oz (72.167 kg)  BMI 22.83 kg/m2  SpO2 99%

## 2015-01-28 NOTE — Progress Notes (Signed)
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 male who is kindly referred by Dr. Marin Olp.  He and his family went down to [place] for vacation over the holidays. Prior to going down, began to have some blurred vision. He visited opthalmology before his vacation and everything checked out.  While down in [place], he had blurred vision and weakness in his right arm. He went to the emergency room where he was hospitalized for five days. CT of the brain showed multiple brain lesions. He then had an MRI done of the brain. The MRI showed at least 5 lesions that were spread throughout.   He did have a CT of the chest done. The CT of the chest showed a right upper lobe mass measuring 4 x 2.5 x 3.8 cm. He had multiple lung nodules bilaterally. He had right hilar and mediastinal lymph nodes. He had no disease elsewhere in the body although there is some indeterminate lesions in the liver.   He then underwent a biopsy of this lung mass. This was done on December 23. The pathology report ([id number]) showed a poorly differentiated squamous cell carcinoma.  He is scheduled for a PET scan on 02/02/15.   PREVIOUS RADIATION THERAPY: No  PAST MEDICAL HISTORY:  has a past medical history of ST elevation myocardial infarction (STEMI) of inferior wall ([place]); CAD (coronary artery disease); Headache(784.0); Peripheral vascular disease ([place]); Arthritis; Metastasis to brain of unknown origin ([place]) (01/27/2015); and Lung cancer, primary, with metastasis from lung to other site [facility]) (01/27/2015).    PAST SURGICAL HISTORY: Past Surgical History  Procedure  Laterality Date  . Hemorroidectomy    . Left heart catheterization with coronary angiogram N/A 12/12/2010    Procedure: LEFT HEART CATHETERIZATION WITH CORONARY ANGIOGRAM;  Surgeon: Hillary Bow, MD;  Location: [facility] CATH LAB;  Service: Cardiovascular;  Laterality: N/A;  . Percutaneous coronary stent intervention (pci-s) N/A 12/12/2010    Procedure: PERCUTANEOUS CORONARY STENT INTERVENTION (PCI-S);  Surgeon: Hillary Bow, MD;  Location: [facility] CATH LAB;  Service: Cardiovascular;  Laterality: N/A;    FAMILY HISTORY: family history includes Cancer in his brother and maternal uncle; Pancreatic cancer in his mother.  SOCIAL HISTORY:  reports that he quit smoking about 24 years ago. His smoking use included Cigarettes. He has a 50 pack-year smoking history. He has never used smokeless tobacco. He reports that he does not drink alcohol or use illicit drugs.  ALLERGIES: Tylenol  MEDICATIONS:  Current Outpatient Prescriptions  Medication Sig Dispense Refill  . aspirin 81 MG tablet Take 81 mg by mouth daily.    Marland Kitchen dexamethasone (DECADRON) 4 MG tablet Take 1 tablet (4 mg total) by mouth 3 (three) times daily. 90 tablet 3  . hydrochlorothiazide (HYDRODIURIL) 25 MG tablet Take 1 tablet (25 mg total) by mouth daily. 90 tablet 3  . Loratadine (CLARITIN) 10 MG CAPS Take by mouth.    . metoprolol (LOPRESSOR) 50 MG tablet Take 1 tablet (50 mg total) by mouth 2 (two) times daily. 180 tablet 3  . potassium chloride SA (K-DUR,KLOR-CON) 20 MEQ tablet Take 1 tablet (20 mEq total) by mouth daily.  90 tablet 3  . rosuvastatin (CRESTOR) 10 MG tablet Take 1 tablet (10 mg total) by mouth daily. 30 tablet 11  . nitroGLYCERIN (NITROSTAT) 0.4 MG SL tablet Place 1 tablet (0.4 mg total) under the tongue every 5 (five) minutes as needed for chest pain. (Patient not taking: Reported on 01/28/2015) 25 tablet 3   No current facility-administered medications for this encounter.    REVIEW OF SYSTEMS:  A 15 point review of  systems is documented in the electronic medical record. This was obtained by the nursing staff. However, I reviewed this with the patient to discuss relevant findings and make appropriate changes.    Patient is here with his wife. He is an Clinical biochemist. Reports blurred vision and weakness in his right arm. Currently taking decadron 4 mg TID. Reports decadron is helping with mild headaches. Reports hand coordination improvement with decadron. Experiencing generalized weakness since he was hospitalized in December. His wife has noticed he has confusion and memory problems. Noticed some difficulty focusing while at work a few days before the [place] vacation. Denies numbness around the face. The visual changes are described as blurry vision rather than double vision. His appetite is good. He has been sleeping well. Notes that he coughed up a little bit of blood after his biopsy. Denies back pain.    PHYSICAL EXAM:  height is '5\' 10"'$  (1.778 m) and weight is 159 lb 1.6 oz (72.167 kg). His oral temperature is 97.6 F (36.4 C). His blood pressure is 153/72 and his pulse is 64. His respiration is 16 and oxygen saturation is 99%.  General: Alert and oriented, in no acute distress HEENT: Head is normocephalic. Extraocular movements are intact. Oropharynx is clear. Hearing aids bilaterally. No thrush noted.  Neck: Neck is supple, no palpable cervical or supraclavicular lymphadenopathy. Heart: Regular in rate and rhythm with no murmurs, rubs, or gallops. Chest: Clear to auscultation bilaterally, with no rhonchi, wheezes, or rales. Skin: No concerning lesions. Musculoskeletal: Strength and muscle tone throughout. Slight right hand weakness noted. Neurologic: Cranial nerves II through XII are grossly intact. No obvious focalities. Speech is fluent. Coordination is intact.  Psychiatric: Judgment and insight are intact. Affect is appropriate.  ECOG = 1   LABORATORY DATA:  Lab Results  Component Value Date   WBC  18.9* 01/27/2015   HGB 13.1 01/27/2015   HCT 39.0 01/27/2015   MCV 88 01/27/2015   PLT 233 01/27/2015   NEUTROABS 17.2* 01/27/2015   Lab Results  Component Value Date   NA 138 01/27/2015   K 4.9 01/27/2015   CL 103 03/04/2014   CO2 28 01/27/2015   GLUCOSE 119 01/27/2015   CREATININE 0.8 01/27/2015   CALCIUM 10.3 01/27/2015      RADIOGRAPHY: Reports from [place] reviewed and reported in history of present illness    IMPRESSION: Stage IV non small cell lung cancer. The patient has an excellent performance status The patient may be a  candidate for [facility] treatment and the patient does prefer this approach if applicable. The patient will be set up ASAP for a 3T MRI. He will be seen soon after this study to determine if he is a candidate for this treatment. If not, he will proceed with whole brain radiation next week.   PLAN: The patient is scheduled for a PET scan on 02/02/15. The patient will undergo a 3T MRI as soon as possible and will be seen after this study to determine if he is a candidate for [facility] treatment.   ------------------------------------------------  Blair Promise, PhD, MD   This document serves as a record of services personally performed by Gery Pray, MD. It was created on his behalf by Arlyce Harman, a trained medical scribe. The creation of this record is based on the scribe's personal observations and the provider's statements to them. This document has been checked and approved by the attending provider.

## 2015-01-29 ENCOUNTER — Encounter: Payer: Self-pay | Admitting: Radiation Therapy

## 2015-01-29 NOTE — Progress Notes (Addendum)
1.  Do you need a wheel chair?    no  2. On oxygen? no  3. Have you ever had any surgery in the body part being scanned?  no  4. Have you ever had any surgery on your brain or heart?  Has a stent inserted 4 years ago                                    5. Have you ever had surgery on your eyes or ears?  no                                     6. Do you have a pacemaker or defibrillator?  no   7. Do you have a Neurostimulator? no  8. Claustrophobic?  A little but no medication needed   9. Any risk for metal in eyes?   no  10. Injury by bullet, buckshot, or shrapnel?  no  11. Stent?       yes - cardiac stent inserted in 2012 here at [place] element plus monorail stent.  Reference number [id number], Lot # [id number]. Product lot # [id number]    Per [employer] this is 3T compatible                                                                                                       12. Hx of Cancer?   Yes, squamous cell lung with mets to the brain                                                                                                      13. Kidney or Liver disease?  no  14. Hx of Lupus, Rheumatoid Arthritis or Scleroderma? no  15. IV Antibiotics or long term use of NSAIDS? no  16. HX of Hypertension?  Yes - controlled with meds   17. Diabetes?  no  18. Allergy to contrast?  no  19. Recent labs. Drawn on 1/4 and in EPIC   No history of radiation in the past   Answers verified over the phone on 1/6 with his wife.  Charles Luna

## 2015-02-01 ENCOUNTER — Ambulatory Visit: Payer: Medicare Other | Admitting: Radiation Oncology

## 2015-02-01 ENCOUNTER — Other Ambulatory Visit: Payer: Self-pay | Admitting: Radiation Oncology

## 2015-02-01 ENCOUNTER — Ambulatory Visit: Payer: Medicare Other

## 2015-02-01 NOTE — Progress Notes (Signed)
Location/Histology of Brain Tumor:  Brain metastasis Primary Lung ,squamous cell   Patient presented with symptoms of:  Blurred vision and numbness  weakness right arm  Past or anticipated interventions, if any, per neurosurgery:   Past or anticipated interventions, if any, per medical oncology:   Dose of Decadron, if applicable: Decadron '4mg'$  tid  Recent neurologic symptoms, if any:   Seizures:   Headaches:   Nausea:   Dizziness/ataxia:   Difficulty with hand coordination: numbness right hand   Focal numbness/weakness:   Visual deficits/changes:  Confusion/Memory deficits:  Painful bone metastases at present, if any:  SAFETY ISSUES:  Prior radiation? NO  Pacemaker/ICD? NO  Is the patient on methotrexate?  NO  Biopsy 01/15/15 [facility].   Additional Complaints / other details:   Pet scheduled : 1/10/20017 ,  MRI Brain scheduled : 02/03/2015  Married,  HX MI(STEMI  12/12/10,  With , stent placement,  Left catheterization), ,CAD,PVD, smoker ,  No alcohol, no drug use, asbestos exposure  Mother cancer,

## 2015-02-02 ENCOUNTER — Encounter (HOSPITAL_COMMUNITY)
Admission: RE | Admit: 2015-02-02 | Discharge: 2015-02-02 | Disposition: A | Discharge status: Home / Self Care | Payer: Medicare Other | Source: Ambulatory Visit | Attending: Hematology & Oncology | Admitting: Hematology & Oncology

## 2015-02-02 DIAGNOSIS — R930 Abnormal findings on diagnostic imaging of skull and head, not elsewhere classified: Secondary | ICD-10-CM

## 2015-02-02 DIAGNOSIS — C801 Malignant (primary) neoplasm, unspecified: Secondary | ICD-10-CM | POA: Insufficient documentation

## 2015-02-02 DIAGNOSIS — C3491 Malignant neoplasm of unspecified part of right bronchus or lung: Secondary | ICD-10-CM

## 2015-02-02 DIAGNOSIS — R937 Abnormal findings on diagnostic imaging of other parts of musculoskeletal system: Secondary | ICD-10-CM | POA: Insufficient documentation

## 2015-02-02 DIAGNOSIS — C7931 Secondary malignant neoplasm of brain: Secondary | ICD-10-CM

## 2015-02-02 LAB — GLUCOSE, CAPILLARY: GLUCOSE-CAPILLARY: 140 mg/dL — AB (ref 65–99)

## 2015-02-02 MED ORDER — FLUDEOXYGLUCOSE F - 18 (FDG) INJECTION
9.7000 | Freq: Once | INTRAVENOUS | Status: AC | PRN
  Administered 2015-02-02: 9.7 via INTRAVENOUS

## 2015-02-03 ENCOUNTER — Ambulatory Visit
Admission: RE | Admit: 2015-02-03 | Discharge: 2015-02-03 | Disposition: A | Discharge status: Home / Self Care | Payer: Medicare Other | Source: Ambulatory Visit | Attending: Radiation Oncology | Admitting: Radiation Oncology

## 2015-02-03 DIAGNOSIS — C7931 Secondary malignant neoplasm of brain: Secondary | ICD-10-CM

## 2015-02-03 MED ORDER — GADOBENATE DIMEGLUMINE 529 MG/ML IV SOLN
14.0000 mL | Freq: Once | INTRAVENOUS | Status: AC | PRN
  Administered 2015-02-03: 14 mL via INTRAVENOUS

## 2015-02-04 ENCOUNTER — Ambulatory Visit
Admission: RE | Admit: 2015-02-04 | Discharge: 2015-02-04 | Disposition: A | Discharge status: Home / Self Care | Payer: Medicare Other | Source: Ambulatory Visit | Attending: Radiation Oncology | Admitting: Radiation Oncology

## 2015-02-04 VITALS — BP 149/77 | HR 102 | Temp 97.6°F | Resp 18

## 2015-02-04 DIAGNOSIS — Z510 Encounter for antineoplastic radiation therapy: Secondary | ICD-10-CM | POA: Diagnosis not present

## 2015-02-04 DIAGNOSIS — C3490 Malignant neoplasm of unspecified part of unspecified bronchus or lung: Secondary | ICD-10-CM

## 2015-02-04 NOTE — Progress Notes (Signed)
Radiation Oncology         [phone] ________________________________  Name: Charles Luna MRN: [id number]  Date: 02/04/2015  DOB: November 23, 1940  Reevaluation Note  CC: Pcp Not In System  Volanda Napoleon, MD   Diagnosis: Stage IV non small cell lung cancer with multiple small brain metastases  Narrative:  The patient returns today for routine follow-up. PET scan on 02/02/15 showed a dominant hypermetabolic right upper lobe mass that is consistent with primary bronchogenic carcinoma. Widespread metastatic disease is noted throughout the neck, chest, abdomen, and pelvis. There is also widespread skeletal, body wall metastases, and liver metastases. 3T MRI of the brain on 12/04/15 showed 70-90 metastatic lesions throughout the brain. Mild vasogenic edema is associated with the largest lesions. The patient is taking decadron 4 mg three times daily. He reports an occasional cough. He has some blurred vision. Having more generalized fatigue and dyspnea with exertion.  ALLERGIES:  is allergic to tylenol.  Meds: Current Outpatient Prescriptions  Medication Sig Dispense Refill  . aspirin 81 MG tablet Take 81 mg by mouth daily.    Marland Kitchen dexamethasone (DECADRON) 4 MG tablet Take 1 tablet (4 mg total) by mouth 3 (three) times daily. 90 tablet 3  . hydrochlorothiazide (HYDRODIURIL) 25 MG tablet Take 1 tablet (25 mg total) by mouth daily. 90 tablet 3  . Loratadine (CLARITIN) 10 MG CAPS Take by mouth.    . metoprolol (LOPRESSOR) 50 MG tablet Take 1 tablet (50 mg total) by mouth 2 (two) times daily. 180 tablet 3  . nitroGLYCERIN (NITROSTAT) 0.4 MG SL tablet Place 1 tablet (0.4 mg total) under the tongue every 5 (five) minutes as needed for chest pain. (Patient not taking: Reported on 01/28/2015) 25 tablet 3  . potassium chloride SA (K-DUR,KLOR-CON) 20 MEQ tablet Take 1 tablet (20 mEq total) by mouth daily. 90 tablet 3  . rosuvastatin (CRESTOR) 10 MG tablet Take 1 tablet (10 mg total) by mouth daily. 30 tablet  11   No current facility-administered medications for this encounter.    Physical Findings: The patient is in no acute distress. Patient is alert and oriented.  Lungs are clear to auscultation bilaterally. Heart has regular rate and rhythm. No palpable cervical, supraclavicular, or axillary adenopathy.  Lab Findings: Lab Results  Component Value Date   WBC 18.9* 01/27/2015   HGB 13.1 01/27/2015   HCT 39.0 01/27/2015   MCV 88 01/27/2015   PLT 233 01/27/2015    Radiographic Findings: Mr Jeri Cos WP Contrast  02/03/2015  CLINICAL DATA:  Recent diagnosis of lung cancer.  Staging. EXAM: MRI HEAD WITHOUT AND WITH CONTRAST TECHNIQUE: Multiplanar, multiecho pulse sequences of the brain and surrounding structures were obtained without and with intravenous contrast. CONTRAST:  66m MULTIHANCE GADOBENATE DIMEGLUMINE 529 MG/ML IV SOLN COMPARISON:  None. FINDINGS: There are on the order of 70 to 90 metastatic lesions scattered throughout the cerebellum and both cerebral hemispheres. Most of these are 3 mm or smaller in size. In the cerebellum, the largest lesion is in the inferior left cerebellum measuring 6 mm. No significant edema or mass effect. In the right cerebral hemisphere, the largest lesion is in the right posterior parietal region measuring 15 mm in diameter with mild vasogenic edema. In the left cerebral hemisphere, the largest lesion is at the left parietal vertex measuring 11 mm in diameter with mild vasogenic edema. None of the lesions show gross hemorrhage. There is no hydrocephalus. No extra-axial collection. No widespread leptomeningeal enhancement. No skullbase metastasis  seen. No pituitary mass. No inflammatory sinus disease. IMPRESSION: Innumerable (70-90) metastatic lesions scattered throughout the brain. Largest cerebellar lesion measures 6 mm. Largest right hemispheric lesion measures 15 mm. Largest left hemispheric lesion measures 11 mm. No pronounced vasogenic edema. Mild vasogenic  edema associated with the larger lesions. No mass effect or shift. No hemorrhage. Electronically Signed   By: Nelson Chimes M.D.   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 EXAM: NUCLEAR MEDICINE PET SKULL BASE TO THIGH TECHNIQUE: 9.7 mCi F-18 FDG was injected intravenously. Full-ring PET imaging was performed from the skull base to thigh after the radiotracer. CT data was obtained and used for attenuation correction and anatomic localization. FASTING BLOOD GLUCOSE:  Value: 140 mg/dl COMPARISON:  None. FINDINGS: NECK Sub-cm hypermetabolic lymph nodes seen in the supraclavicular regions bilaterally. CHEST Dominant multi lobulated mass in the right upper lobe measuring 2.5 cm is hypermetabolic, with SUV max of 12.3. Multiple adjacent satellite nodules in the right upper lobe are also hypermetabolic. There also scattered sub-cm pulmonary nodules seen in both lungs, which are too numerous to count. Multiple show metabolic activity, onsistent with bilateral pulmonary metastases. Hypermetabolic lymphadenopathy is seen in right hilum, with SUV max of 20.0. Hypermetabolic lymphadenopathy is also seen in the mediastinum in the right paratracheal and subcarinal regions. SUV max of right paratracheal lymphadenopathy is 17.9. Mild hypermetabolic adenopathy is also seen in both axillary regions. Multiple small hypermetabolic foci are also seen within bilateral chest wall musculature and subcutaneous tissues which are too numerous to count, consistent with chest wall soft tissue metastases. ABDOMEN/PELVIS Diffuse hypermetabolic liver metastases are seen throughout the right and left lobes. Mild hypermetabolic lymphadenopathy seen within the upper abdomen and small bowel mesentery, consistent with metastatic disease. There are also multiple small hypermetabolic peritoneal nodules seen in the abdomen and  pelvis, consistent with metastatic lymph nodes or peritoneal implants. Multiple small hypermetabolic metastases are seen throughout the iliopsoas muscles and bilateral gluteal muscles bilaterally. Several small hypermetabolic metastases are seen within subcutaneous tissues of the abdominal wall bilaterally. SKELETON Widespread hypermetabolic bone metastases are seen involving the pelvis, spine, and several bilateral ribs. IMPRESSION: Dominant hypermetabolic right upper lobe mass, consistent with primary bronchogenic carcinoma. Widespread metastatic disease throughout the neck, chest, abdomen, and pelvis as described above. In addition to widespread metastatic lymphadenopathy and diffuse liver metastases, there is widespread skeletal and body wall (intramuscular and subcutaneous) metastases. Electronically Signed   By: Earle Gell M.D.   On: 02/02/2015 15:34    Impression: Stage IV non small cell lung cancer with multiple small brain metastases. He is not a candidate for brain SRS. He is a good candidate for whole brain radiation. I discussed the treatment course, side effects, and long term toxicities with the patient and his family. He would like to proceed with the planned course of treatment.  Plan: Whole brain CT simulation scheduled today. Patient will start treatments early next week and will continue on Decadron. ____________________________________  Blair Promise, PhD, MD  This document serves as a record of services personally performed by Gery Pray, MD. It was created on his behalf by Darcus Austin, a trained medical scribe. The creation of this record is based on the scribe's personal observations and the provider's statements to them. This document has been checked and approved by the attending provider.

## 2015-02-05 ENCOUNTER — Encounter: Payer: Self-pay | Admitting: Hematology & Oncology

## 2015-02-05 ENCOUNTER — Other Ambulatory Visit: Payer: Medicare Other

## 2015-02-06 NOTE — Progress Notes (Signed)
  Radiation Oncology         [phone] ________________________________  Name: Charles Luna MRN: [id number]  Date: 02/04/2015  DOB: 02/22/1940  SIMULATION AND TREATMENT PLANNING NOTE    ICD-9-CM ICD-10-CM   1. Lung cancer, primary, with metastasis from lung to other site, unspecified laterality ([place]) 162.9 C34.90     DIAGNOSIS: Stage IV lung cancer with brain metastasis  NARRATIVE:  The patient was brought to the [facility].  Identity was confirmed.  All relevant records and images related to the planned course of therapy were reviewed.  The patient freely provided informed written consent to proceed with treatment after reviewing the details related to the planned course of therapy. The consent form was witnessed and verified by the simulation staff.  Then, the patient was set-up in a stable reproducible  supine position for radiation therapy.  CT images were obtained.  Surface markings were placed.  The CT images were loaded into the planning software.  Then the target and avoidance structures were contoured.  Treatment planning then occurred.  The radiation prescription was entered and confirmed.  Then, I designed and supervised the construction of a total of 3 medically necessary complex treatment devices.  I have requested : Isodose Plan.  I have ordered:dose calc.  PLAN:  The patient will receive 35 Gy in 14 fractions.  ________________________________  -----------------------------------  Blair Promise, PhD, MD

## 2015-02-08 ENCOUNTER — Ambulatory Visit
Admission: RE | Admit: 2015-02-08 | Discharge: 2015-02-08 | Disposition: A | Discharge status: Home / Self Care | Payer: Medicare Other | Source: Ambulatory Visit | Attending: Radiation Oncology | Admitting: Radiation Oncology

## 2015-02-08 DIAGNOSIS — Z510 Encounter for antineoplastic radiation therapy: Secondary | ICD-10-CM | POA: Diagnosis not present

## 2015-02-09 ENCOUNTER — Ambulatory Visit
Admission: RE | Admit: 2015-02-09 | Discharge: 2015-02-09 | Disposition: A | Discharge status: Home / Self Care | Payer: Medicare Other | Source: Ambulatory Visit | Attending: Radiation Oncology | Admitting: Radiation Oncology

## 2015-02-09 ENCOUNTER — Encounter: Payer: Self-pay | Admitting: Radiation Oncology

## 2015-02-09 VITALS — BP 125/66 | HR 92 | Temp 97.7°F | Resp 18 | Ht 70.0 in | Wt 153.9 lb

## 2015-02-09 DIAGNOSIS — C7931 Secondary malignant neoplasm of brain: Secondary | ICD-10-CM

## 2015-02-09 DIAGNOSIS — Z510 Encounter for antineoplastic radiation therapy: Secondary | ICD-10-CM | POA: Diagnosis not present

## 2015-02-09 DIAGNOSIS — C801 Malignant (primary) neoplasm, unspecified: Principal | ICD-10-CM

## 2015-02-09 DIAGNOSIS — C3490 Malignant neoplasm of unspecified part of unspecified bronchus or lung: Secondary | ICD-10-CM

## 2015-02-09 MED ORDER — SONAFINE EX EMUL
1.0000 "application " | Freq: Once | CUTANEOUS | Status: AC
  Administered 2015-02-09: 1 via TOPICAL
  Filled 2015-02-09: qty 45

## 2015-02-09 NOTE — Progress Notes (Signed)
  Radiation Oncology         ([phone] ________________________________  Name: Charles Luna MRN: [id number]  Date: 02/09/2015  DOB: 1940-11-29  Weekly Radiation Therapy Management    ICD-9-CM ICD-10-CM   1. Lung cancer, primary, with metastasis from lung to other site, unspecified laterality ([place]) 162.9 C34.90      Current Dose: 2.5 Gy     Planned Dose:  35 Gy  Narrative . . . . . . . . The patient presents for routine under treatment assessment.                                   The patient denies any nausea or headaches does complain of continued blurred vision. He also complains of progressive weakness,  lower extremities. The patient has resorted to using a walker at home and was brought in by wheelchair today.                                 Set-up films were reviewed.                                 The chart was checked. Physical Findings. . .  height is '5\' 10"'$  (1.778 m) and weight is 153 lb 14.4 oz (69.809 kg). His oral temperature is 97.7 F (36.5 C). His blood pressure is 125/66 and his pulse is 92. His respiration is 18 and oxygen saturation is 100%. . Weight essentially stable. No secondary infection noted in the oral cavity. The lungs are clear. The heart has regular rhythm and rate. Impression . . . . . . . The patient is tolerating radiation. Plan . . . . . . . . . . . . Continue treatment as planned. Patient may possibly be having some weakness related to his steroids. He has been on this medication for some time. I spoke with Dr. Marin Olp and plan is for the patient to be reduced to Decadron twice daily followed by once daily next week.  ________________________________   Blair Promise, PhD, MD

## 2015-02-09 NOTE — Progress Notes (Signed)
Pt here for patient teaching.  Pt given Radiation and You booklet and Sonafine.  Reviewed areas of pertinence such as fatigue, hair loss, nausea and vomiting, skin changes and headache . Pt able to give teach back of to pat skin and use unscented/gentle soap,apply Sonafine bid and avoid applying anything to skin within 4 hours of treatment. Pt demonstrated understanding and verbalizes understanding of information given and will contact nursing with any questions or concerns.          

## 2015-02-09 NOTE — Progress Notes (Signed)
Donel Rondinelli has completed 1 fraction to his brain.  He denies having mild headaches.  He reports his vision is blurry but comes and goes.  His wife reports he is getting progressively weaker.  He is using a walker at home now.  He is in a wheelchair today.  He reports his legs are weak.  He also reports urinating more often.  He is taking decadron 4 mg three times a day.  He has lost 6 lbs since 1/5.  Orthostatic vitals taken: bp sitting 125/66, hr 92, bp standing 128/61, hr 101.  He reports he has not felt like eating.   BP 125/66 mmHg  Pulse 92  Temp(Src) 97.7 F (36.5 C) (Oral)  Resp 18  Ht '5\' 10"'$  (1.778 m)  Wt 153 lb 14.4 oz (69.809 kg)  BMI 22.08 kg/m2  SpO2 100%

## 2015-02-10 ENCOUNTER — Ambulatory Visit
Admission: RE | Admit: 2015-02-10 | Discharge: 2015-02-10 | Disposition: A | Discharge status: Home / Self Care | Payer: Medicare Other | Source: Ambulatory Visit | Attending: Radiation Oncology | Admitting: Radiation Oncology

## 2015-02-10 DIAGNOSIS — Z510 Encounter for antineoplastic radiation therapy: Secondary | ICD-10-CM | POA: Diagnosis not present

## 2015-02-11 ENCOUNTER — Ambulatory Visit
Admission: RE | Admit: 2015-02-11 | Discharge: 2015-02-11 | Disposition: A | Discharge status: Home / Self Care | Payer: Medicare Other | Source: Ambulatory Visit | Attending: Radiation Oncology | Admitting: Radiation Oncology

## 2015-02-11 DIAGNOSIS — Z510 Encounter for antineoplastic radiation therapy: Secondary | ICD-10-CM | POA: Diagnosis not present

## 2015-02-12 ENCOUNTER — Inpatient Hospital Stay (HOSPITAL_COMMUNITY)
Admission: EM | Admit: 2015-02-12 | Discharge: 2015-02-19 | DRG: 377 | Disposition: A | Discharge status: Hospice | Payer: Medicare Other | Attending: Internal Medicine | Admitting: Internal Medicine

## 2015-02-12 ENCOUNTER — Telehealth: Payer: Self-pay | Admitting: Oncology

## 2015-02-12 ENCOUNTER — Encounter (HOSPITAL_COMMUNITY): Payer: Self-pay

## 2015-02-12 ENCOUNTER — Ambulatory Visit: Payer: Medicare Other

## 2015-02-12 ENCOUNTER — Telehealth: Payer: Self-pay | Admitting: *Deleted

## 2015-02-12 ENCOUNTER — Emergency Department (HOSPITAL_COMMUNITY): Payer: Medicare Other

## 2015-02-12 DIAGNOSIS — I2510 Atherosclerotic heart disease of native coronary artery without angina pectoris: Secondary | ICD-10-CM | POA: Diagnosis present

## 2015-02-12 DIAGNOSIS — D62 Acute posthemorrhagic anemia: Secondary | ICD-10-CM | POA: Diagnosis present

## 2015-02-12 DIAGNOSIS — E785 Hyperlipidemia, unspecified: Secondary | ICD-10-CM | POA: Diagnosis present

## 2015-02-12 DIAGNOSIS — R1013 Epigastric pain: Secondary | ICD-10-CM | POA: Diagnosis not present

## 2015-02-12 DIAGNOSIS — C7800 Secondary malignant neoplasm of unspecified lung: Secondary | ICD-10-CM | POA: Insufficient documentation

## 2015-02-12 DIAGNOSIS — C7931 Secondary malignant neoplasm of brain: Secondary | ICD-10-CM | POA: Diagnosis present

## 2015-02-12 DIAGNOSIS — D6959 Other secondary thrombocytopenia: Secondary | ICD-10-CM | POA: Diagnosis present

## 2015-02-12 DIAGNOSIS — R930 Abnormal findings on diagnostic imaging of skull and head, not elsewhere classified: Secondary | ICD-10-CM

## 2015-02-12 DIAGNOSIS — R627 Adult failure to thrive: Secondary | ICD-10-CM | POA: Diagnosis present

## 2015-02-12 DIAGNOSIS — E860 Dehydration: Secondary | ICD-10-CM | POA: Diagnosis present

## 2015-02-12 DIAGNOSIS — C3491 Malignant neoplasm of unspecified part of right bronchus or lung: Secondary | ICD-10-CM

## 2015-02-12 DIAGNOSIS — R059 Cough, unspecified: Secondary | ICD-10-CM

## 2015-02-12 DIAGNOSIS — C3490 Malignant neoplasm of unspecified part of unspecified bronchus or lung: Secondary | ICD-10-CM | POA: Diagnosis present

## 2015-02-12 DIAGNOSIS — N3289 Other specified disorders of bladder: Secondary | ICD-10-CM | POA: Diagnosis not present

## 2015-02-12 DIAGNOSIS — Z87891 Personal history of nicotine dependence: Secondary | ICD-10-CM

## 2015-02-12 DIAGNOSIS — K254 Chronic or unspecified gastric ulcer with hemorrhage: Secondary | ICD-10-CM | POA: Diagnosis present

## 2015-02-12 DIAGNOSIS — D649 Anemia, unspecified: Secondary | ICD-10-CM | POA: Diagnosis not present

## 2015-02-12 DIAGNOSIS — E876 Hypokalemia: Secondary | ICD-10-CM | POA: Diagnosis not present

## 2015-02-12 DIAGNOSIS — R05 Cough: Secondary | ICD-10-CM

## 2015-02-12 DIAGNOSIS — D500 Iron deficiency anemia secondary to blood loss (chronic): Secondary | ICD-10-CM | POA: Diagnosis not present

## 2015-02-12 DIAGNOSIS — K922 Gastrointestinal hemorrhage, unspecified: Secondary | ICD-10-CM | POA: Diagnosis not present

## 2015-02-12 DIAGNOSIS — Z886 Allergy status to analgesic agent status: Secondary | ICD-10-CM

## 2015-02-12 DIAGNOSIS — R770 Abnormality of albumin: Secondary | ICD-10-CM | POA: Diagnosis not present

## 2015-02-12 DIAGNOSIS — I10 Essential (primary) hypertension: Secondary | ICD-10-CM | POA: Diagnosis present

## 2015-02-12 DIAGNOSIS — I252 Old myocardial infarction: Secondary | ICD-10-CM

## 2015-02-12 DIAGNOSIS — Z515 Encounter for palliative care: Secondary | ICD-10-CM | POA: Diagnosis not present

## 2015-02-12 DIAGNOSIS — J61 Pneumoconiosis due to asbestos and other mineral fibers: Secondary | ICD-10-CM | POA: Diagnosis present

## 2015-02-12 DIAGNOSIS — G893 Neoplasm related pain (acute) (chronic): Secondary | ICD-10-CM | POA: Diagnosis present

## 2015-02-12 DIAGNOSIS — C3492 Malignant neoplasm of unspecified part of left bronchus or lung: Secondary | ICD-10-CM

## 2015-02-12 DIAGNOSIS — I739 Peripheral vascular disease, unspecified: Secondary | ICD-10-CM | POA: Diagnosis present

## 2015-02-12 DIAGNOSIS — R937 Abnormal findings on diagnostic imaging of other parts of musculoskeletal system: Secondary | ICD-10-CM

## 2015-02-12 DIAGNOSIS — Z66 Do not resuscitate: Secondary | ICD-10-CM | POA: Diagnosis present

## 2015-02-12 DIAGNOSIS — D638 Anemia in other chronic diseases classified elsewhere: Secondary | ICD-10-CM

## 2015-02-12 DIAGNOSIS — R531 Weakness: Secondary | ICD-10-CM

## 2015-02-12 DIAGNOSIS — R0602 Shortness of breath: Secondary | ICD-10-CM

## 2015-02-12 DIAGNOSIS — G936 Cerebral edema: Secondary | ICD-10-CM | POA: Diagnosis present

## 2015-02-12 DIAGNOSIS — Z955 Presence of coronary angioplasty implant and graft: Secondary | ICD-10-CM

## 2015-02-12 DIAGNOSIS — Z7952 Long term (current) use of systemic steroids: Secondary | ICD-10-CM

## 2015-02-12 DIAGNOSIS — Z791 Long term (current) use of non-steroidal anti-inflammatories (NSAID): Secondary | ICD-10-CM | POA: Diagnosis not present

## 2015-02-12 DIAGNOSIS — E43 Unspecified severe protein-calorie malnutrition: Secondary | ICD-10-CM | POA: Diagnosis present

## 2015-02-12 DIAGNOSIS — K259 Gastric ulcer, unspecified as acute or chronic, without hemorrhage or perforation: Secondary | ICD-10-CM | POA: Diagnosis not present

## 2015-02-12 DIAGNOSIS — E7800 Pure hypercholesterolemia, unspecified: Secondary | ICD-10-CM

## 2015-02-12 DIAGNOSIS — D696 Thrombocytopenia, unspecified: Secondary | ICD-10-CM | POA: Diagnosis present

## 2015-02-12 DIAGNOSIS — Z7982 Long term (current) use of aspirin: Secondary | ICD-10-CM | POA: Diagnosis not present

## 2015-02-12 DIAGNOSIS — Z6823 Body mass index (BMI) 23.0-23.9, adult: Secondary | ICD-10-CM | POA: Diagnosis not present

## 2015-02-12 DIAGNOSIS — N329 Bladder disorder, unspecified: Secondary | ICD-10-CM | POA: Diagnosis not present

## 2015-02-12 DIAGNOSIS — C801 Malignant (primary) neoplasm, unspecified: Secondary | ICD-10-CM

## 2015-02-12 LAB — URINALYSIS, ROUTINE W REFLEX MICROSCOPIC
Glucose, UA: NEGATIVE mg/dL
HGB URINE DIPSTICK: NEGATIVE
Ketones, ur: NEGATIVE mg/dL
NITRITE: NEGATIVE
PROTEIN: 30 mg/dL — AB
SPECIFIC GRAVITY, URINE: 1.038 — AB (ref 1.005–1.030)
pH: 6 (ref 5.0–8.0)

## 2015-02-12 LAB — BASIC METABOLIC PANEL
ANION GAP: 14 (ref 5–15)
BUN: 28 mg/dL — ABNORMAL HIGH (ref 6–20)
CALCIUM: 9.7 mg/dL (ref 8.9–10.3)
CO2: 25 mmol/L (ref 22–32)
CREATININE: 0.77 mg/dL (ref 0.61–1.24)
Chloride: 96 mmol/L — ABNORMAL LOW (ref 101–111)
Glucose, Bld: 214 mg/dL — ABNORMAL HIGH (ref 65–99)
Potassium: 3.8 mmol/L (ref 3.5–5.1)
Sodium: 135 mmol/L (ref 135–145)

## 2015-02-12 LAB — CBC
HCT: 29.4 % — ABNORMAL LOW (ref 39.0–52.0)
HEMOGLOBIN: 9.7 g/dL — AB (ref 13.0–17.0)
MCH: 29.5 pg (ref 26.0–34.0)
MCHC: 33 g/dL (ref 30.0–36.0)
MCV: 89.4 fL (ref 78.0–100.0)
PLATELETS: 144 10*3/uL — AB (ref 150–400)
RBC: 3.29 MIL/uL — AB (ref 4.22–5.81)
RDW: 15 % (ref 11.5–15.5)
WBC: 8.8 10*3/uL (ref 4.0–10.5)

## 2015-02-12 LAB — URINE MICROSCOPIC-ADD ON
RBC / HPF: NONE SEEN RBC/hpf (ref 0–5)
SQUAMOUS EPITHELIAL / LPF: NONE SEEN

## 2015-02-12 LAB — ABO/RH: ABO/RH(D): O POS

## 2015-02-12 LAB — POC OCCULT BLOOD, ED: Fecal Occult Bld: POSITIVE — AB

## 2015-02-12 LAB — CBG MONITORING, ED: Glucose-Capillary: 177 mg/dL — ABNORMAL HIGH (ref 65–99)

## 2015-02-12 MED ORDER — MORPHINE SULFATE (PF) 2 MG/ML IV SOLN
1.0000 mg | INTRAVENOUS | Status: DC | PRN
  Administered 2015-02-12: 1 mg via INTRAVENOUS
  Administered 2015-02-12 – 2015-02-15 (×9): 2 mg via INTRAVENOUS
  Administered 2015-02-15: 1 mg via INTRAVENOUS
  Administered 2015-02-15 – 2015-02-16 (×2): 2 mg via INTRAVENOUS
  Filled 2015-02-12 (×13): qty 1

## 2015-02-12 MED ORDER — ROSUVASTATIN CALCIUM 10 MG PO TABS
10.0000 mg | ORAL_TABLET | Freq: Every day | ORAL | Status: DC
  Administered 2015-02-12 – 2015-02-16 (×4): 10 mg via ORAL
  Filled 2015-02-12 (×6): qty 1

## 2015-02-12 MED ORDER — METOPROLOL TARTRATE 50 MG PO TABS
50.0000 mg | ORAL_TABLET | Freq: Two times a day (BID) | ORAL | Status: DC
  Administered 2015-02-12 – 2015-02-19 (×13): 50 mg via ORAL
  Filled 2015-02-12 (×15): qty 1

## 2015-02-12 MED ORDER — PREMIER PROTEIN SHAKE
11.0000 [oz_av] | Freq: Two times a day (BID) | ORAL | Status: DC
  Administered 2015-02-15 – 2015-02-18 (×6): 11 [oz_av] via ORAL
  Filled 2015-02-12 (×3): qty 325.31

## 2015-02-12 MED ORDER — LORATADINE 10 MG PO TABS: 10.0000 mg | ORAL_TABLET | Freq: Every day | ORAL | Status: DC | PRN

## 2015-02-12 MED ORDER — SODIUM CHLORIDE 0.9 % IV BOLUS (SEPSIS)
1000.0000 mL | Freq: Once | INTRAVENOUS | Status: AC
  Administered 2015-02-12: 1000 mL via INTRAVENOUS

## 2015-02-12 MED ORDER — ONDANSETRON HCL 4 MG PO TABS: 4.0000 mg | ORAL_TABLET | Freq: Four times a day (QID) | ORAL | Status: DC | PRN

## 2015-02-12 MED ORDER — ONDANSETRON HCL 4 MG/2ML IJ SOLN: 4.0000 mg | Freq: Four times a day (QID) | INTRAMUSCULAR | Status: DC | PRN

## 2015-02-12 MED ORDER — DEXAMETHASONE 4 MG PO TABS
4.0000 mg | ORAL_TABLET | Freq: Two times a day (BID) | ORAL | Status: DC
  Administered 2015-02-12 – 2015-02-14 (×4): 4 mg via ORAL
  Filled 2015-02-12 (×6): qty 1

## 2015-02-12 MED ORDER — SODIUM CHLORIDE 0.9 % IV SOLN
INTRAVENOUS | Status: DC
Start: 2015-02-12 — End: 2015-02-19
  Administered 2015-02-13 – 2015-02-18 (×2): via INTRAVENOUS

## 2015-02-12 MED ORDER — PANTOPRAZOLE SODIUM 40 MG IV SOLR
40.0000 mg | Freq: Two times a day (BID) | INTRAVENOUS | Status: DC
  Administered 2015-02-12 – 2015-02-13 (×4): 40 mg via INTRAVENOUS
  Filled 2015-02-12 (×5): qty 40

## 2015-02-12 NOTE — H&P (Signed)
[facility] History and Physical  Charles Luna JSE:[id number] DOB: 1940/09/11 DOA: 02/12/2015  Referring physician: ER physician; Dr. Constance Luna PCP: Charles Mocha, MD*  Chief Complaint: "not feeling well  HPI:  75 year old male with previous medical history significant for asbestosis Bowsher, 50-pack-year history of tobacco use, stopped about 25 years ago. He was seen by Dr. Marin Luna of oncology earlier in January of this year and this is for follow-up on his finding of metastatic brain cancer. Apparently this patient was [facility] in [place] and developed blurred vision, head imaging studies fair with multiple brain lesions. We don't have imaging studies of the CAT scan but what was done in [place] apparently he had a right upper lobe mass lesion with pulmonary nodules and hilar and mediastinal lymphadenopathy but no disease in other places. He is now starting radiation therapy under Dr. Sondra Luna. Because of the brain lesions and vasogenic edema on MRI he was placed on Decadron.  Patient now presented to [facility] long hospital because of generalized weakness, poor by mouth intake, fatigue and overall not feeling well. There was no reports of respiratory distress but patient does say he has intermittent nonproductive cough. No reports of fevers or chills. No reports of abdominal pain, nausea or vomiting. No reports of diarrhea or constipation. No reports of blood in the stool or urine.  In ED, patient was hemodynamically stable. Blood work demonstrated hemoglobin of 9.7 and platelets of 144. ED MD spoke with the family and patient who reported DNR code status. Consult was placed to palliative care as well for goals of care   Assessment & Plan    Principal Problem:   Weakness / Dehydration / severe protein calorie malnutrition / failure to thrive in adult - Likely in the setting of metastatic lung cancer - Will provide supportive care with IV fluids - Consult placed for  nutritionist - Diet as tolerated  Active Problems:   Lung cancer, primary, with metastasis from lung to other site [facility]) / brain metastasis - Patient is undergoing radiation therapy, radiation treatment canceled for today but will resume on Monday - Chest x-ray on this admission showed irregular nodular lesion in the right upper lobe unchanged from recent PET study    Anemia of chronic disease / Thrombocytopenia ([place]) - Likely in the setting of malignancy - Hemoglobin is 9.7 and platelets are 144, stable    Dyslipidemia - Continue statin therapy    Benign essential HTN - Continue metoprolol   DVT prophylaxis:  - SCDs bilaterally  Radiological Exams on Admission: Dg Chest 2 View 02/12/2015   Irregular nodular lesion right upper lobe, unchanged from recent PET study. No edema or consolidation. No change in cardiac silhouette. Right paratracheal adenopathy, again better delineated by CT. Electronically Signed   By: Charles Luna M.D.   On: 02/12/2015 12:50     Code Status: Full Family Communication: Plan of care discussed with the patient and his wife  Disposition Plan: Admit for further evaluation  Charles Lenz, MD  [facility] Pager [phone]  Time spent in minutes: 75 minutes  Review of Systems:  Constitutional: Negative for fever, chills and positive for malaise/fatigue. Negative for diaphoresis.  HENT: Negative for hearing loss, ear pain, nosebleeds, congestion, sore throat, neck pain, tinnitus and ear discharge.   Eyes: Negative for blurred vision, double vision, photophobia, pain, discharge and redness.  Respiratory: Negative for cough, hemoptysis, sputum production, shortness of breath, wheezing and stridor.   Cardiovascular: Negative for chest pain, palpitations, orthopnea,  claudication and leg swelling.  Gastrointestinal: Negative for nausea, vomiting and abdominal pain. Negative for heartburn, constipation, blood in stool and melena.  Genitourinary:  Negative for dysuria, urgency, frequency, hematuria and flank pain.  Musculoskeletal: Negative for myalgias, back pain, joint pain and falls.  Skin: Negative for itching and rash.  Neurological: Negative for dizziness and positive for weakness. Negative for tingling, tremors, sensory change, speech change, focal weakness, loss of consciousness and headaches.  Endo/Heme/Allergies: Negative for environmental allergies and polydipsia. Does not bruise/bleed easily.  Psychiatric/Behavioral: Negative for suicidal ideas. The patient is not nervous/anxious.      Past Medical History  Diagnosis Date  . ST elevation myocardial infarction (STEMI) of inferior wall ([place])     12/12/10 - tx with Promus DES to the PL artery  . CAD (coronary artery disease)     LHC 12/12/10: oD1 70% (small), pRI 70-75%, AV CFX 75%, mRCA 40%, dRCA 30%, PLA 95% tx with DES, EF 55%, inf HK  . Headache(784.0)   . Peripheral vascular disease ([place])   . Arthritis   . Metastasis to brain of unknown origin ([place]) 01/27/2015  . Lung cancer, primary, with metastasis from lung to other site [facility]) 01/27/2015   Past Surgical History  Procedure Laterality Date  . Hemorroidectomy    . Left heart catheterization with coronary angiogram N/A 12/12/2010    Procedure: LEFT HEART CATHETERIZATION WITH CORONARY ANGIOGRAM;  Surgeon: Charles Bow, MD;  Location: [facility] CATH LAB;  Service: Cardiovascular;  Laterality: N/A;  . Percutaneous coronary stent intervention (pci-s) N/A 12/12/2010    Procedure: PERCUTANEOUS CORONARY STENT INTERVENTION (PCI-S);  Surgeon: Charles Bow, MD;  Location: [facility] CATH LAB;  Service: Cardiovascular;  Laterality: N/A;   Social History:  reports that he quit smoking about 24 years ago. His smoking use included Cigarettes. He has a 50 pack-year smoking history. He has never used smokeless tobacco. He reports that he does not drink alcohol or use illicit drugs.  Allergies  Allergen Reactions  . Tylenol [Acetaminophen]  Shortness Of Breath    Family History:  Family History  Problem Relation Age of Onset  . Pancreatic cancer Mother   . Cancer Brother   . Cancer Maternal Uncle      Prior to Admission medications   Medication Sig Start Date End Date Taking? Authorizing Provider  aspirin 81 MG tablet Take 81 mg by mouth daily.   Yes Historical Provider, MD  dexamethasone (DECADRON) 4 MG tablet Take 1 tablet (4 mg total) by mouth 3 (three) times daily. Patient taking differently: Take 4 mg by mouth 2 (two) times daily.  01/27/15  Yes Volanda Napoleon, MD  hydrochlorothiazide (HYDRODIURIL) 25 MG tablet Take 1 tablet (25 mg total) by mouth daily. 03/10/14  Yes Charles Mocha, MD  ibuprofen (ADVIL,MOTRIN) 200 MG tablet Take 200 mg by mouth every 6 (six) hours as needed for moderate pain.   Yes Historical Provider, MD  Loratadine (CLARITIN) 10 MG CAPS Take 10 mg by mouth daily as needed (allergies).    Yes Historical Provider, MD  metoprolol (LOPRESSOR) 50 MG tablet Take 1 tablet (50 mg total) by mouth 2 (two) times daily. 03/10/14  Yes Charles Mocha, MD  naproxen sodium (ANAPROX) 220 MG tablet Take 220 mg by mouth daily as needed (pain).   Yes Historical Provider, MD  nitroGLYCERIN (NITROSTAT) 0.4 MG SL tablet Place 1 tablet (0.4 mg total) under the tongue every 5 (five) minutes as needed for chest pain. 03/10/14 06/07/16 Yes Charles Mocha,  MD  potassium chloride SA (K-DUR,KLOR-CON) 20 MEQ tablet Take 1 tablet (20 mEq total) by mouth daily. 03/10/14  Yes Charles Mocha, MD  rosuvastatin (CRESTOR) 10 MG tablet Take 1 tablet (10 mg total) by mouth daily. 03/23/14  Yes Charles Mocha, MD  senna (SENOKOT) 8.6 MG tablet Take 1 tablet by mouth daily.   Yes Historical Provider, MD  Wound Dressings (SONAFINE EX) Apply topically.    Historical Provider, MD   Physical Exam: Filed Vitals:   02/12/15 1132 02/12/15 1200 02/12/15 1201 02/12/15 1216  BP:  113/71 113/71   Pulse:  116 104   Temp: 97.4 F (36.3 C)   97.9 F (36.6  C)  TempSrc: Oral   Rectal  Resp:  21 22   Height:      Weight:      SpO2:  98% 98%     Physical Exam  Constitutional: Appears ill, no distress. Pale skin HENT: Normocephalic. No tonsillar erythema or exudates Eyes: Conjunctivae are normal. No scleral icterus.  Neck: Normal ROM. Neck supple. No JVD. No tracheal deviation. No thyromegaly.  CVS: RRR, S1/S2 appreciated  Pulmonary: Effort and breath sounds normal, no stridor, rhonchi, wheezes, rales.  Abdominal: Soft. BS +,  no distension, tenderness, rebound or guarding.  Musculoskeletal: Normal range of motion. No edema and no tenderness.  Lymphadenopathy: No lymphadenopathy noted, cervical, inguinal. Neuro: Alert. Normal reflexes, muscle tone coordination. No focal neurologic deficits. Skin: Skin is warm and dry. No rash noted.  No erythema. No pallor.  Psychiatric: Normal mood and affect. Behavior, judgment, thought content normal.   Labs on Admission:  Basic Metabolic Panel:  Recent Labs Lab 02/12/15 1151  NA 135  K 3.8  CL 96*  CO2 25  GLUCOSE 214*  BUN 28*  CREATININE 0.77  CALCIUM 9.7   Liver Function Tests: No results for input(s): AST, ALT, ALKPHOS, BILITOT, PROT, ALBUMIN in the last 168 hours. No results for input(s): LIPASE, AMYLASE in the last 168 hours. No results for input(s): AMMONIA in the last 168 hours. CBC:  Recent Labs Lab 02/12/15 1151  WBC 8.8  HGB 9.7*  HCT 29.4*  MCV 89.4  PLT 144*   Cardiac Enzymes: No results for input(s): CKTOTAL, CKMB, CKMBINDEX, TROPONINI in the last 168 hours. BNP: Invalid input(s): POCBNP CBG:  Recent Labs Lab 02/12/15 1145  GLUCAP 177*    If 7PM-7AM, please contact night-coverage [email or web address] Password [email or web address] 02/12/2015, 1:16 PM

## 2015-02-12 NOTE — ED Notes (Signed)
Pt given urinal, states he will let us know when he has produced urine sample.

## 2015-02-12 NOTE — ED Notes (Signed)
Admitting MD at bedside.

## 2015-02-12 NOTE — Telephone Encounter (Signed)
Discussed patient's weakness, pain, recent decadron taper with Dr. Lisbeth Renshaw.  He recommended that Charles Luna go the ER to be evaluated.  Called Charles Luna back and advised her to take [name] to the ER per Dr. Lisbeth Renshaw.  Charles Luna verbalized agreement and will bring him the [facility] ER.

## 2015-02-12 NOTE — ED Notes (Signed)
Pt c/o increasing weakness and generalized body pain since December 2016.  Pain score 6/10.  Pt reports pain is worse in shoulders and back.  Hx of lung CA w/ mets brain.  Pt has had daily radiation x 4 days.

## 2015-02-12 NOTE — Telephone Encounter (Signed)
[name] left a message saying that Charles Luna is going to be admitted to [facility] and will not be able to keep his radiation appointment today.  Notified Faith, RT on [place] 2 to cancel appointment.

## 2015-02-12 NOTE — Telephone Encounter (Signed)
Charles Luna called and said Charles Luna is not felling good and is asking to be taken to the hospital.  She said he started having generalized pain yesterday.  He continues to have fatigue and weakness.  He was tapered down to decadron 4 mg twice a day.  He was taking it 3 times a day.  She said he has not had a temperature.  She is going to try to bring him before treatment today to see a doctor.  Advised her to call if he is going to go to the ER.

## 2015-02-12 NOTE — ED Notes (Signed)
Nurse in rm starting iv will collect labs

## 2015-02-12 NOTE — Progress Notes (Signed)
Initial Nutrition Assessment  DOCUMENTATION CODES:   Severe malnutrition in context of chronic illness  INTERVENTION:   Premier Protein shakes BID (chocolate only)  NUTRITION DIAGNOSIS:   Malnutrition related to chronic illness as evidenced by severe depletion of body fat, severe depletion of muscle mass, 10 percent weight loss x 3 months.  GOAL:   Patient will meet greater than or equal to 90% of their needs  MONITOR:   PO intake, Supplement acceptance, I & O's, Weight trends  REASON FOR ASSESSMENT:   Malnutrition Screening Tool   ASSESSMENT:   Pt from [facility] with primary lung cancer, seen by [facility] for brain mets in 01/2015. Pt receiving XRT.    Pt is HOH, wife provides hx.  Pt has been losing weight since 10/16. He had lost about 10 lb prior to this intentionally. Starting in 10-16 he lost his appetite and lost 10% of his weight. Pt has been "eating like a pig" per wife and also drinking chocolate premier protein shakes.   Nutrition-Focused physical exam completed. Findings are severe fat depletion, severe muscle depletion, and no edema.    Diet Order:  Diet regular Room service appropriate?: Yes; Fluid consistency:: Thin  Skin:  Reviewed, no issues  Last BM:  1/20, tarry stools  Height:   Ht Readings from Last 1 Encounters:  02/12/15 '5\' 10"'$  (1.778 m)   Weight:   Wt Readings from Last 1 Encounters:  02/12/15 152 lb 12.5 oz (69.3 kg)   Ideal Body Weight:  75.4 kg  BMI:  Body mass index is 21.92 kg/(m^2).  Estimated Nutritional Needs:   Kcal:  2000-2200  Protein:  100-110 grams  Fluid:  > 2 L/day  EDUCATION NEEDS:   No education needs identified at this time  [place], [place], [place] Pager [phone] After Hours Pager

## 2015-02-12 NOTE — Telephone Encounter (Signed)
Error

## 2015-02-12 NOTE — ED Provider Notes (Signed)
CSN: [id number]     Arrival date & time 02/12/15  1115 History   First MD Initiated Contact with Patient 02/12/15 1158     Chief Complaint  Patient presents with  . CA Pt   . Weakness  . Generalized Body Aches     (Consider location/radiation/quality/duration/timing/severity/associated sxs/prior Treatment) HPI Complains of generalized weakness onset 4 days ago. He denies pain denies fever. Admits to cough for 2 days. No other associated symptoms. Weakness is worse with standing or attempting to walk, improved with remaining still. No treatment prior to coming here Past Medical History  Diagnosis Date  . ST elevation myocardial infarction (STEMI) of inferior wall ([place])     12/12/10 - tx with Promus DES to the PL artery  . CAD (coronary artery disease)     LHC 12/12/10: oD1 70% (small), pRI 70-75%, AV CFX 75%, mRCA 40%, dRCA 30%, PLA 95% tx with DES, EF 55%, inf HK  . Headache(784.0)   . Peripheral vascular disease ([place])   . Arthritis   . Metastasis to brain of unknown origin ([place]) 01/27/2015  . Lung cancer, primary, with metastasis from lung to other site [facility]) 01/27/2015   Past Surgical History  Procedure Laterality Date  . Hemorroidectomy    . Left heart catheterization with coronary angiogram N/A 12/12/2010    Procedure: LEFT HEART CATHETERIZATION WITH CORONARY ANGIOGRAM;  Surgeon: Hillary Bow, MD;  Location: [facility] CATH LAB;  Service: Cardiovascular;  Laterality: N/A;  . Percutaneous coronary stent intervention (pci-s) N/A 12/12/2010    Procedure: PERCUTANEOUS CORONARY STENT INTERVENTION (PCI-S);  Surgeon: Hillary Bow, MD;  Location: [facility] CATH LAB;  Service: Cardiovascular;  Laterality: N/A;   Family History  Problem Relation Age of Onset  . Pancreatic cancer Mother   . Cancer Brother   . Cancer Maternal Uncle    Social History  Substance Use Topics  . Smoking status: Former Smoker -- 2.00 packs/day for 25 years    Types: Cigarettes    Quit date: 12/13/1990  .  Smokeless tobacco: Never Used  . Alcohol Use: No    Review of Systems  HENT: Negative.   Respiratory: Positive for cough.   Cardiovascular: Negative.   Gastrointestinal: Positive for constipation.  Musculoskeletal: Negative.   Skin: Negative.   Allergic/Immunologic: Positive for immunocompromised state.       Cancer patient  Neurological: Positive for weakness.  Psychiatric/Behavioral: Negative.   All other systems reviewed and are negative.     Allergies  Tylenol  Home Medications   Prior to Admission medications   Medication Sig Start Date End Date Taking? Authorizing Provider  aspirin 81 MG tablet Take 81 mg by mouth daily.   Yes Historical Provider, MD  dexamethasone (DECADRON) 4 MG tablet Take 1 tablet (4 mg total) by mouth 3 (three) times daily. Patient taking differently: Take 4 mg by mouth 2 (two) times daily.  01/27/15  Yes Volanda Napoleon, MD  hydrochlorothiazide (HYDRODIURIL) 25 MG tablet Take 1 tablet (25 mg total) by mouth daily. 03/10/14  Yes Sherren Mocha, MD  ibuprofen (ADVIL,MOTRIN) 200 MG tablet Take 200 mg by mouth every 6 (six) hours as needed for moderate pain.   Yes Historical Provider, MD  Loratadine (CLARITIN) 10 MG CAPS Take 10 mg by mouth daily as needed (allergies).    Yes Historical Provider, MD  metoprolol (LOPRESSOR) 50 MG tablet Take 1 tablet (50 mg total) by mouth 2 (two) times daily. 03/10/14  Yes Sherren Mocha, MD  naproxen sodium Linna Hoff)  220 MG tablet Take 220 mg by mouth daily as needed (pain).   Yes Historical Provider, MD  nitroGLYCERIN (NITROSTAT) 0.4 MG SL tablet Place 1 tablet (0.4 mg total) under the tongue every 5 (five) minutes as needed for chest pain. 03/10/14 06/07/16 Yes Sherren Mocha, MD  potassium chloride SA (K-DUR,KLOR-CON) 20 MEQ tablet Take 1 tablet (20 mEq total) by mouth daily. 03/10/14  Yes Sherren Mocha, MD  rosuvastatin (CRESTOR) 10 MG tablet Take 1 tablet (10 mg total) by mouth daily. 03/23/14  Yes Sherren Mocha, MD   senna (SENOKOT) 8.6 MG tablet Take 1 tablet by mouth daily.   Yes Historical Provider, MD  Wound Dressings (SONAFINE EX) Apply topically.    Historical Provider, MD   BP 113/71 mmHg  Pulse 104  Temp(Src) 97.9 F (36.6 C) (Rectal)  Resp 22  Ht '5\' 10"'$  (1.778 m)  Wt 153 lb (69.4 kg)  BMI 21.95 kg/m2  SpO2 98% Physical Exam  Constitutional:  Chronically and acutely ill-appearing  HENT:  Head: Normocephalic and atraumatic.  Mucous membranes pale dry  Eyes: Conjunctivae are normal. Pupils are equal, round, and reactive to light.  Neck: Neck supple. No tracheal deviation present. No thyromegaly present.  Cardiovascular: Regular rhythm.   No murmur heard. Tachycardic  Pulmonary/Chest: Effort normal.  Scant diffuse rhonchi  Abdominal: Soft. Bowel sounds are normal. He exhibits no distension. There is no tenderness.  Genitourinary: Guaiac positive stool.  Melanotic stool normal tone nontender  Musculoskeletal: Normal range of motion. He exhibits no edema or tenderness.  Neurological: He is alert. Coordination normal.  Skin: Skin is warm and dry. No rash noted.  Psychiatric: He has a normal mood and affect.  Nursing note and vitals reviewed.   ED Course  Procedures (including critical care time) Labs Review Labs Reviewed  BASIC METABOLIC PANEL - Abnormal; Notable for the following:    Chloride 96 (*)    Glucose, Bld 214 (*)    BUN 28 (*)    All other components within normal limits  CBC - Abnormal; Notable for the following:    RBC 3.29 (*)    Hemoglobin 9.7 (*)    HCT 29.4 (*)    Platelets 144 (*)    All other components within normal limits  CBG MONITORING, ED - Abnormal; Notable for the following:    Glucose-Capillary 177 (*)    All other components within normal limits  POC OCCULT BLOOD, ED - Abnormal; Notable for the following:    Fecal Occult Bld POSITIVE (*)    All other components within normal limits  URINALYSIS, ROUTINE W REFLEX MICROSCOPIC (NOT AT [facility])   TYPE AND SCREEN    Imaging Review No results found. I have personally reviewed and evaluated these images and lab results as part of my medical decision-making.   EKG Interpretation   Date/Time:  Friday February 12 2015 11:36:47 EST Ventricular Rate:  110 PR Interval:  149 QRS Duration: 93 QT Interval:  349 QTC Calculation: 472 R Axis:   49 Text Interpretation:  Sinus tachycardia Probable left atrial enlargement  Borderline T wave abnormalities Baseline wander in lead(s) V2 SINCE LAST  TRACING HEART RATE HAS INCREASED Confirmed by Winfred Leeds  MD, Darold Miley [phone])  on 02/12/2015 12:44:00 PM     chest xray viewd by me MDM  I had discussion with patient and his wife. He is a DO NOT RESUSCITATE CODE STATUS. I've consulted Dr.Golding from the palliative care team who will evaluate patient while in the hospital. Goals  of care need to be discussed. I consulted Dr.Devine who will evaluate patient and arrange for admission. Initial plan is IV hydration, stop NSAIDs. Patient does not require blood transfusion presently. Diagnosis acute upper GI bleed Final diagnoses:  None  CRITICAL CARE Performed by: Orlie Dakin Total critical care time: 30 minutes Critical care time was exclusive of separately billable procedures and treating other patients. Critical care was necessary to treat or prevent imminent or life-threatening deterioration. Critical care was time spent personally by me on the following activities: development of treatment plan with patient and/or surrogate as well as nursing, discussions with consultants, evaluation of patient's response to treatment, examination of patient, obtaining history from patient or surrogate, ordering and performing treatments and interventions, ordering and review of laboratory studies, ordering and review of radiographic studies, pulse oximetry and re-evaluation of patient's condition.      Orlie Dakin, MD 02/12/15 1315

## 2015-02-13 DIAGNOSIS — D649 Anemia, unspecified: Secondary | ICD-10-CM

## 2015-02-13 DIAGNOSIS — R05 Cough: Secondary | ICD-10-CM

## 2015-02-13 DIAGNOSIS — D62 Acute posthemorrhagic anemia: Secondary | ICD-10-CM

## 2015-02-13 DIAGNOSIS — K922 Gastrointestinal hemorrhage, unspecified: Secondary | ICD-10-CM | POA: Diagnosis present

## 2015-02-13 LAB — CBC
HCT: 23.7 % — ABNORMAL LOW (ref 39.0–52.0)
Hemoglobin: 7.7 g/dL — ABNORMAL LOW (ref 13.0–17.0)
MCH: 29.3 pg (ref 26.0–34.0)
MCHC: 32.5 g/dL (ref 30.0–36.0)
MCV: 90.1 fL (ref 78.0–100.0)
PLATELETS: 108 10*3/uL — AB (ref 150–400)
RBC: 2.63 MIL/uL — AB (ref 4.22–5.81)
RDW: 15.2 % (ref 11.5–15.5)
WBC: 6.6 10*3/uL (ref 4.0–10.5)

## 2015-02-13 LAB — COMPREHENSIVE METABOLIC PANEL
ALK PHOS: 156 U/L — AB (ref 38–126)
ALT: 76 U/L — AB (ref 17–63)
AST: 69 U/L — AB (ref 15–41)
Albumin: 1.8 g/dL — ABNORMAL LOW (ref 3.5–5.0)
Anion gap: 9 (ref 5–15)
BUN: 26 mg/dL — AB (ref 6–20)
CALCIUM: 8.2 mg/dL — AB (ref 8.9–10.3)
CO2: 26 mmol/L (ref 22–32)
CREATININE: 0.73 mg/dL (ref 0.61–1.24)
Chloride: 101 mmol/L (ref 101–111)
Glucose, Bld: 166 mg/dL — ABNORMAL HIGH (ref 65–99)
Potassium: 4.2 mmol/L (ref 3.5–5.1)
SODIUM: 136 mmol/L (ref 135–145)
Total Bilirubin: 1 mg/dL (ref 0.3–1.2)
Total Protein: 4.9 g/dL — ABNORMAL LOW (ref 6.5–8.1)

## 2015-02-13 LAB — PREPARE RBC (CROSSMATCH)

## 2015-02-13 LAB — GLUCOSE, CAPILLARY: GLUCOSE-CAPILLARY: 159 mg/dL — AB (ref 65–99)

## 2015-02-13 MED ORDER — SODIUM CHLORIDE 0.9 % IV SOLN: Freq: Once | INTRAVENOUS | Status: DC

## 2015-02-13 NOTE — Consult Note (Signed)
Referral MD  Reason for Referral: Metastatic Non-Small Cell Lung Cancer  Chief Complaint  Patient presents with  . CA Pt   . Weakness  . Generalized Body Aches  : I am very weak  HPI: Mr. Charles Luna is a very nice 75 year old white male. I first met him in early January. He and his wife had gone down to [place] for vacation. He began to have some neurological issues. 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  He lives up in the [place] area. 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 He currently is undergoing brain radiation.  He was now admitted because of weakness. He says he just does not have a lot of energy. He has only had 4 radiation treatments.  He had a recent PET scan which showed widely metastatic disease.  He is quite anemic. His hemoglobin today is 7.7. He probably will need to be transfused. His stools are heme positive. He probably needs to have GI see him for upper and lower endoscopy.  He says is not hurting.  He says appetite is down.  Not noted any obvious bleeding. He has not noted any melena.  He's had a little bit of a cough. He's had no nausea or vomiting. He has not noted any rashes. He's had no leg swelling. He's had no mouth sores. He's had no dysphagia or odynophagia.  With his labs, his calcium is 8.2 with albumin of 1.8. His white cell count 6.6. His platelet count is 108K.  Overall, his performance status is ECOG 1-2.   History reviewed. No pertinent past medical history.:  Past Surgical History  Procedure Laterality Date  . Hemorroidectomy    . Left heart catheterization with coronary angiogram N/A 12/12/2010    Procedure: LEFT HEART CATHETERIZATION WITH CORONARY ANGIOGRAM;  Surgeon: Hillary Bow, MD;  Location: [facility] CATH LAB;  Service: Cardiovascular;  Laterality: N/A;  . Percutaneous coronary stent intervention (pci-s) N/A 12/12/2010    Procedure: PERCUTANEOUS CORONARY STENT INTERVENTION (PCI-S);  Surgeon: Hillary Bow, MD;  Location: [facility] CATH LAB;  Service: Cardiovascular;  Laterality: N/A;  :   Current facility-administered medications:  .  0.9 %  sodium chloride infusion, , Intravenous, Continuous, Robbie Lis, MD, Last Rate: 75 mL/hr at 02/12/15 1956 .  dexamethasone (DECADRON) tablet 4 mg, 4 mg, Oral, BID, Robbie Lis, MD, 4 mg at 02/12/15 1650 .  loratadine (CLARITIN) tablet 10 mg, 10 mg, Oral, Daily PRN, Robbie Lis, MD .  metoprolol (LOPRESSOR) tablet 50 mg, 50 mg, Oral, BID, Robbie Lis, MD, 50 mg at 02/12/15 1650 .  morphine 2 MG/ML injection 1-2 mg, 1-2 mg, Intravenous, Q2H PRN, Robbie Lis, MD, 2 mg at 02/12/15 2353 .  ondansetron (ZOFRAN) tablet 4 mg, 4 mg, Oral, Q6H PRN **OR** ondansetron (ZOFRAN) injection 4 mg, 4 mg, Intravenous, Q6H PRN, Robbie Lis, MD .  pantoprazole (PROTONIX) injection 40 mg, 40 mg, Intravenous, Q12H, Robbie Lis, MD, 40 mg at 02/12/15 2055 .  protein supplement (PREMIER PROTEIN) liquid, 11 oz, Oral, BID BM, Asencion Islam, RD, 11 oz at 02/12/15 1600 .  rosuvastatin (CRESTOR) tablet 10 mg, 10 mg, Oral, Daily, Robbie Lis, MD, 10 mg at 02/12/15 1650:  . dexamethasone  4 mg Oral BID  . metoprolol  50 mg Oral BID  . pantoprazole (PROTONIX) IV  40 mg Intravenous Q12H  . protein supplement shake  11 oz Oral BID BM  .  rosuvastatin  10 mg Oral Daily  :  Allergies  Allergen Reactions  . Tylenol [Acetaminophen] Shortness Of Breath  :  Family History  Problem Relation Age of Onset  . Pancreatic cancer Mother   . Cancer Brother   . Cancer Maternal Uncle   :  Social History   Social History  . Marital Status: Married    Spouse Name: N/A  . Number of Children: 2  . Years of Education: N/A   Occupational History  . Electrician    Social History Main Topics  . Smoking status: Former Smoker -- 2.00 packs/day for 25 years    Types: Cigarettes    Quit date: 12/13/1990  . Smokeless tobacco: Never Used  . Alcohol Use: No  . Drug Use: No   . Sexual Activity: Yes   Other Topics Concern  . Not on file   Social History Narrative  :  Pertinent items are noted in HPI.  Exam: Patient Vitals for the past 24 hrs:  BP Temp Temp src Pulse Resp SpO2 Height Weight  02/13/15 0513 128/60 mmHg 97.8 F (36.6 C) Oral 77 16 97 % - 160 lb 7.9 oz (72.8 kg)  02/12/15 2040 112/63 mmHg 97.8 F (36.6 C) Oral 81 18 98 % - -  02/12/15 1424 131/72 mmHg 97.6 F (36.4 C) Oral 98 20 99 % 5' 10"  (1.778 m) 152 lb 12.5 oz (69.3 kg)  02/12/15 1216 - 97.9 F (36.6 C) Rectal - - - - -  02/12/15 1201 113/71 mmHg - - 104 22 98 % - -  02/12/15 1200 113/71 mmHg - - 116 21 98 % - -  02/12/15 1132 - 97.4 F (36.3 C) Oral - - - - -  02/12/15 1124 - - Oral (!) 122 24 100 % 5' 10"  (1.778 m) 153 lb (69.4 kg)    as above    Recent Labs  02/12/15 1151 02/13/15 0426  WBC 8.8 6.6  HGB 9.7* 7.7*  HCT 29.4* 23.7*  PLT 144* 108*    Recent Labs  02/12/15 1151 02/13/15 0426  NA 135 136  K 3.8 4.2  CL 96* 101  CO2 25 26  GLUCOSE 214* 166*  BUN 28* 26*  CREATININE 0.77 0.73  CALCIUM 9.7 8.2*    Blood smear review:  None  Pathology: None     Assessment and Plan: Mr. Charles Luna is a very nice 75 year old white male. He has widely metastatic non-small cell lung cancer. This is a squamous cell carcinoma. His recent PET scan is incredibly positive for widespread disease.  Of note, I did call down to [place]. They did run a PD-L1 assay. This was negative.  His hemoglobin is dropping. I think he clearly will need to be transfused. His stools are heme positive. I think he will clearly need an upper and lower endoscopy. We had to see if he has metastatic disease to his intestines that might be causing some of the bleeding.  Hopefully, we can get him built back up a little bit better.  His heart assay how much more radiation he can handle. He has 14 treatments. He's had for so far.  I agree with the no CODE STATUS. I think this is very reasonable.  I  feel bad that his wife broke her shoulder. He'll be hard for her now. He will be hard for him also.  I think he is well aware of the difficult situation that he has.  I think we are going to  do any chemotherapy, he will need a Port-A-Cath. I will get this taken care of next week.  I think for right now, the blessing that can be done for him is a blood transfusion. I did this may help him feel better. I think GI needs to see him so that a upper and lower endoscopy to be done to assess for GI bleeding.  I very much appreciate the outstanding care that he is getting on the floor on [address]. The staff is doing a tremendous job.  Frederich Cha 1: 5-7

## 2015-02-13 NOTE — Progress Notes (Signed)
[facility] PROGRESS NOTE    Progress Note   Charles Luna IZT:[id number] DOB: [birth year] DOA: 02/12/2015 PCP: Sherren Mocha, MD   Brief Narrative:   Charles Luna is an 75 y.o. male medical history significant for asbestosis Bowsher, 50-pack-year history of tobacco use, stopped about 25 years ago. He was seen by Dr. Marin Olp of oncology earlier in January of this year and this is for follow-up on his finding of metastatic brain cancer.   Assessment/Plan:  Generalized weakness/dehydration, acute blood loss anemia/normocytic anemia/normocytic anemia In the setting of metastatic lung cancer with ongoing radiation and drop hemoglobin from 13 on 01/27/2015 to 7.7 on 02/13/2015. I will go ahead and transfuse him 1 unit of packed red blood cells as he is symptomatic. At home he is on aspirin and NSAIDs and steroids, I am concerned about an upper GI bleed, with a FOBT positive, place nothing by mouth. Start him on PPI twice a day  Primary lung cancer with metastatic brain lesions: Patient will resume radiation therapy on Monday.  Thrombocytopenia: In the setting of malignancy. Continue to trend.  Essential hypertension: Seems to be fairly control continue metoprolol.     DVT Prophylaxis - Place SCDs  Family Communication: wife Disposition Plan: Home in 2 -3 days Code Status:     Code Status Orders        Start     Ordered   02/12/15 1425  Do not attempt resuscitation (DNR)   Continuous    Question Answer Comment  In the event of cardiac or respiratory ARREST Do not call a "code blue"   In the event of cardiac or respiratory ARREST Do not perform Intubation, CPR, defibrillation or ACLS   In the event of cardiac or respiratory ARREST Use medication by any route, position, wound care, and other measures to relive pain and suffering. May use oxygen, suction and manual treatment of airway obstruction as needed for comfort.      02/12/15 1424    Code Status History    Date Active Date Inactive Code Status Order ID Comments User Context   02/12/2015 12:58 PM 02/12/2015  2:24 PM DNR [id number]  Orlie Dakin, MD ED        IV Access:    Peripheral IV   Procedures and diagnostic studies:   Dg Chest 2 View  02/12/2015  CLINICAL DATA:  Shortness of breath ; lung carcinoma EXAM: CHEST  2 VIEW COMPARISON:  PET-CT February 02, 2015; chest radiograph January 25, 2012 FINDINGS: There is an irregular nodular lesion in the right upper lobe measuring 2.1 x 1.0 cm, better appreciable on the recent PET study. Multiple subcentimeter nodular lesions seen on recent CT study are not well seen by radiography. There is no appreciable edema or consolidation. Heart size and pulmonary vascularity are normal. There is right paratracheal region adenopathy. No bone lesions. IMPRESSION: Irregular nodular lesion right upper lobe, unchanged from recent PET study. No edema or consolidation. No change in cardiac silhouette. Right paratracheal adenopathy, again better delineated by CT. Electronically Signed   By: Lowella Grip III M.D.   On: 02/12/2015 12:50     Medical Consultants:    None.  Anti-Infectives:   Anti-infectives    None      Subjective:    Charles Luna he relates he feels lightheaded, denies any chest pain but shortness of breath with ambulation.  Objective:    Filed Vitals:   02/12/15 1216 02/12/15 1424 02/12/15 2040 02/13/15 0513  BP:  131/72 112/63 128/60  Pulse:  98 81 77  Temp: 97.9 F (36.6 C) 97.6 F (36.4 C) 97.8 F (36.6 C) 97.8 F (36.6 C)  TempSrc: Rectal Oral Oral Oral  Resp:  '20 18 16  '$ Height:  '5\' 10"'$  (1.778 m)    Weight:  69.3 kg (152 lb 12.5 oz)  72.8 kg (160 lb 7.9 oz)  SpO2:  99% 98% 97%    Intake/Output Summary (Last 24 hours) at 02/13/15 0944 Last data filed at 02/13/15 0700  Gross per 24 hour  Intake    713 ml  Output    450 ml  Net    263 ml   Filed Weights   02/12/15 1124 02/12/15 1424 02/13/15 0513  Weight:  69.4 kg (153 lb) 69.3 kg (152 lb 12.5 oz) 72.8 kg (160 lb 7.9 oz)    Exam: Gen:  NAD Cardiovascular:  RRR. Chest and lungs:   Good air movement clear to auscultation Abdomen:  Abdomen soft, NT/ND, + BS Extremities:  No edema.   Data Reviewed:    Labs: Basic Metabolic Panel:  Recent Labs Lab 02/12/15 1151 02/13/15 0426  NA 135 136  K 3.8 4.2  CL 96* 101  CO2 25 26  GLUCOSE 214* 166*  BUN 28* 26*  CREATININE 0.77 0.73  CALCIUM 9.7 8.2*   GFR Estimated Creatinine Clearance: 83.4 mL/min (by C-G formula based on Cr of 0.73). Liver Function Tests:  Recent Labs Lab 02/13/15 0426  AST 69*  ALT 76*  ALKPHOS 156*  BILITOT 1.0  PROT 4.9*  ALBUMIN 1.8*   No results for input(s): LIPASE, AMYLASE in the last 168 hours. No results for input(s): AMMONIA in the last 168 hours. Coagulation profile No results for input(s): INR, PROTIME in the last 168 hours.  CBC:  Recent Labs Lab 02/12/15 1151 02/13/15 0426  WBC 8.8 6.6  HGB 9.7* 7.7*  HCT 29.4* 23.7*  MCV 89.4 90.1  PLT 144* 108*   Cardiac Enzymes: No results for input(s): CKTOTAL, CKMB, CKMBINDEX, TROPONINI in the last 168 hours. BNP (last 3 results) No results for input(s): PROBNP in the last 8760 hours. CBG:  Recent Labs Lab 02/12/15 1145 02/13/15 0741  GLUCAP 177* 159*   D-Dimer: No results for input(s): DDIMER in the last 72 hours. Hgb A1c: No results for input(s): HGBA1C in the last 72 hours. Lipid Profile: No results for input(s): CHOL, HDL, LDLCALC, TRIG, CHOLHDL, LDLDIRECT in the last 72 hours. Thyroid function studies: No results for input(s): TSH, T4TOTAL, T3FREE, THYROIDAB in the last 72 hours.  Invalid input(s): FREET3 Anemia work up: No results for input(s): VITAMINB12, FOLATE, FERRITIN, TIBC, IRON, RETICCTPCT in the last 72 hours. Sepsis Labs:  Recent Labs Lab 02/12/15 1151 02/13/15 0426  WBC 8.8 6.6   Microbiology No results found for this or any previous visit (from the  past 240 hour(s)).   Medications:   . dexamethasone  4 mg Oral BID  . metoprolol  50 mg Oral BID  . pantoprazole (PROTONIX) IV  40 mg Intravenous Q12H  . protein supplement shake  11 oz Oral BID BM  . rosuvastatin  10 mg Oral Daily   Continuous Infusions: . sodium chloride 75 mL/hr at 02/12/15 1956    Time spent: 25 min   LOS: 1 day   Charlynne Cousins  [facility] Pager [phone]  *Please refer to [email or web address], password [email or web address] to get updated schedule on who will round on this patient, as hospitalists switch teams weekly. If 7PM-7AM,  please contact night-coverage at [email or web address], password [email or web address] for any overnight needs.  02/13/2015, 9:44 AM

## 2015-02-13 NOTE — Progress Notes (Signed)
Patient ID: Charles Luna, male   DOB: [birth year], 75 y.o.   MRN: [id number]   Referring Provider: Dr. Aileen Fass Primary Care Physician:  Sherren Mocha, MD Primary Gastroenterologist:  Althia Forts  Reason for Consultation:  GI bleed  HPI: Charles Luna is a 75 y.o. male is being seen for a consult due to GI bleeding with melena daily for the past week. Black sticky stools occurred an unknown number of times each day. Denies associated nausea/vomiting/abdominal pain/dizziness/lightheadedness. Patient on radiation treatment for brain mets from metastatic lung cancer. Has been taking Advil once a day Q 6 hours for 2 days prior to admit. Hgb 9.7 (13.1 on 01/27/15). Hgb 7.7 today. Denies any previous colonoscopy or EGD. Denies any previous history of black stools. Wife and sister at bedside.   History reviewed. No pertinent past medical history.  Past Surgical History  Procedure Laterality Date  . Hemorroidectomy    . Left heart catheterization with coronary angiogram N/A 12/12/2010    Procedure: LEFT HEART CATHETERIZATION WITH CORONARY ANGIOGRAM;  Surgeon: Hillary Bow, MD;  Location: [facility] CATH LAB;  Service: Cardiovascular;  Laterality: N/A;  . Percutaneous coronary stent intervention (pci-s) N/A 12/12/2010    Procedure: PERCUTANEOUS CORONARY STENT INTERVENTION (PCI-S);  Surgeon: Hillary Bow, MD;  Location: [facility] CATH LAB;  Service: Cardiovascular;  Laterality: N/A;    Prior to Admission medications   Medication Sig Start Date End Date Taking? Authorizing Provider  aspirin 81 MG tablet Take 81 mg by mouth daily.   Yes Historical Provider, MD  dexamethasone (DECADRON) 4 MG tablet Take 1 tablet (4 mg total) by mouth 3 (three) times daily. Patient taking differently: Take 4 mg by mouth 2 (two) times daily.  01/27/15  Yes Volanda Napoleon, MD  hydrochlorothiazide (HYDRODIURIL) 25 MG tablet Take 1 tablet (25 mg total) by mouth daily. 03/10/14  Yes Sherren Mocha, MD  ibuprofen (ADVIL,MOTRIN) 200  MG tablet Take 200 mg by mouth every 6 (six) hours as needed for moderate pain.   Yes Historical Provider, MD  Loratadine (CLARITIN) 10 MG CAPS Take 10 mg by mouth daily as needed (allergies).    Yes Historical Provider, MD  metoprolol (LOPRESSOR) 50 MG tablet Take 1 tablet (50 mg total) by mouth 2 (two) times daily. 03/10/14  Yes Sherren Mocha, MD  naproxen sodium (ANAPROX) 220 MG tablet Take 220 mg by mouth daily as needed (pain).   Yes Historical Provider, MD  nitroGLYCERIN (NITROSTAT) 0.4 MG SL tablet Place 1 tablet (0.4 mg total) under the tongue every 5 (five) minutes as needed for chest pain. 03/10/14 06/07/16 Yes Sherren Mocha, MD  potassium chloride SA (K-DUR,KLOR-CON) 20 MEQ tablet Take 1 tablet (20 mEq total) by mouth daily. 03/10/14  Yes Sherren Mocha, MD  rosuvastatin (CRESTOR) 10 MG tablet Take 1 tablet (10 mg total) by mouth daily. 03/23/14  Yes Sherren Mocha, MD  senna (SENOKOT) 8.6 MG tablet Take 1 tablet by mouth daily.   Yes Historical Provider, MD  Wound Dressings (SONAFINE EX) Apply topically.    Historical Provider, MD    Scheduled Meds: . sodium chloride   Intravenous Once  . dexamethasone  4 mg Oral BID  . metoprolol  50 mg Oral BID  . pantoprazole (PROTONIX) IV  40 mg Intravenous Q12H  . protein supplement shake  11 oz Oral BID BM  . rosuvastatin  10 mg Oral Daily   Continuous Infusions: . sodium chloride 75 mL/hr at 02/12/15 1956   PRN Meds:.loratadine, morphine injection,  ondansetron **OR** ondansetron (ZOFRAN) IV  Allergies as of 02/12/2015 - Review Complete 02/12/2015  Allergen Reaction Noted  . Tylenol [acetaminophen] Shortness Of Breath 12/13/2010    Family History  Problem Relation Age of Onset  . Pancreatic cancer Mother   . Cancer Brother   . Cancer Maternal Uncle     Social History   Social History  . Marital Status: Married    Spouse Name: N/A  . Number of Children: 2  . Years of Education: N/A   Occupational History  . Electrician     Social History Main Topics  . Smoking status: Former Smoker -- 2.00 packs/day for 25 years    Types: Cigarettes    Quit date: 12/13/1990  . Smokeless tobacco: Never Used  . Alcohol Use: No  . Drug Use: No  . Sexual Activity: Yes   Other Topics Concern  . Not on file   Social History Narrative    Review of Systems: All negative except as stated above in HPI.  Physical Exam: Vital signs: Filed Vitals:   02/12/15 2040 02/13/15 0513  BP: 112/63 128/60  Pulse: 81 77  Temp: 97.8 F (36.6 C) 97.8 F (36.6 C)  Resp: 18 16   Last BM Date: 02/12/15 General:   Lethargic, pale, Well-developed, well-nourished, pleasant and cooperative in NAD Head: atraumatic Eyes: anicteric sclera ENT: oropharynx clear Neck: supple, nontender Lungs:  Clear throughout to auscultation.   No wheezes, crackles, or rhonchi. No acute distress. Heart:  Regular rate and rhythm; no murmurs, clicks, rubs,  or gallops. Abdomen: soft, nontender, nondistended, +BS  Rectal:  Deferred Ext: no edema Skin: no rash  GI:  Lab Results:  Recent Labs  02/12/15 1151 02/13/15 0426  WBC 8.8 6.6  HGB 9.7* 7.7*  HCT 29.4* 23.7*  PLT 144* 108*   BMET  Recent Labs  02/12/15 1151 02/13/15 0426  NA 135 136  K 3.8 4.2  CL 96* 101  CO2 25 26  GLUCOSE 214* 166*  BUN 28* 26*  CREATININE 0.77 0.73  CALCIUM 9.7 8.2*   LFT  Recent Labs  02/13/15 0426  PROT 4.9*  ALBUMIN 1.8*  AST 69*  ALT 76*  ALKPHOS 156*  BILITOT 1.0   PT/INR No results for input(s): LABPROT, INR in the last 72 hours.   Studies/Results: Dg Chest 2 View  02/12/2015  CLINICAL DATA:  Shortness of breath ; lung carcinoma EXAM: CHEST  2 VIEW COMPARISON:  PET-CT February 02, 2015; chest radiograph January 25, 2012 FINDINGS: There is an irregular nodular lesion in the right upper lobe measuring 2.1 x 1.0 cm, better appreciable on the recent PET study. Multiple subcentimeter nodular lesions seen on recent CT study are not well seen by  radiography. There is no appreciable edema or consolidation. Heart size and pulmonary vascularity are normal. There is right paratracheal region adenopathy. No bone lesions. IMPRESSION: Irregular nodular lesion right upper lobe, unchanged from recent PET study. No edema or consolidation. No change in cardiac silhouette. Right paratracheal adenopathy, again better delineated by CT. Electronically Signed   By: Lowella Grip III M.D.   On: 02/12/2015 12:50    Impression/Plan: Melena and severe anemia concerning for an upper GI bleed. Hemodynamically stable. Suspect peptic ulcer bleed with recent NSAID use. Agree with blood transfusion. EGD tomorrow to assess for a source of the melena. Clear liquid diet. NPO p MN. IV PPI Q 12 hours. Risks/benefits of the EGD discussed and he and his wife agree to proceed.  LOS: 1 day   Deville C.  02/13/2015, 1:08 PM  Pager [phone]  If no answer or after 5 PM call [phone]

## 2015-02-14 ENCOUNTER — Encounter (HOSPITAL_COMMUNITY): Payer: Self-pay

## 2015-02-14 ENCOUNTER — Encounter (HOSPITAL_COMMUNITY)
Admission: EM | Disposition: A | Discharge status: Hospice | Payer: Self-pay | Source: Home / Self Care | Attending: Internal Medicine

## 2015-02-14 HISTORY — PX: ESOPHAGOGASTRODUODENOSCOPY: SHX5428

## 2015-02-14 LAB — CBC
HCT: 27.8 % — ABNORMAL LOW (ref 39.0–52.0)
HEMOGLOBIN: 9 g/dL — AB (ref 13.0–17.0)
MCH: 27.7 pg (ref 26.0–34.0)
MCHC: 32.4 g/dL (ref 30.0–36.0)
MCV: 85.5 fL (ref 78.0–100.0)
PLATELETS: 106 10*3/uL — AB (ref 150–400)
RBC: 3.25 MIL/uL — ABNORMAL LOW (ref 4.22–5.81)
RDW: 15.3 % (ref 11.5–15.5)
WBC: 7.6 10*3/uL (ref 4.0–10.5)

## 2015-02-14 LAB — GLUCOSE, CAPILLARY: Glucose-Capillary: 135 mg/dL — ABNORMAL HIGH (ref 65–99)

## 2015-02-14 SURGERY — EGD (ESOPHAGOGASTRODUODENOSCOPY)
Anesthesia: Moderate Sedation

## 2015-02-14 MED ORDER — FENTANYL CITRATE (PF) 100 MCG/2ML IJ SOLN
INTRAMUSCULAR | Status: AC
  Filled 2015-02-14: qty 2

## 2015-02-14 MED ORDER — LIP MEDEX EX OINT
TOPICAL_OINTMENT | CUTANEOUS | Status: AC
  Administered 2015-02-14: 08:00:00
  Filled 2015-02-14: qty 7

## 2015-02-14 MED ORDER — MIDAZOLAM HCL 5 MG/ML IJ SOLN
INTRAMUSCULAR | Status: AC
  Filled 2015-02-14: qty 2

## 2015-02-14 MED ORDER — PANTOPRAZOLE SODIUM 40 MG IV SOLR: 40.0000 mg | Freq: Two times a day (BID) | INTRAVENOUS | Status: DC

## 2015-02-14 MED ORDER — FENTANYL CITRATE (PF) 100 MCG/2ML IJ SOLN
INTRAMUSCULAR | Status: DC | PRN
  Administered 2015-02-14: 25 ug via INTRAVENOUS

## 2015-02-14 MED ORDER — MIDAZOLAM HCL 10 MG/2ML IJ SOLN
INTRAMUSCULAR | Status: DC | PRN
  Administered 2015-02-14: 2 mg via INTRAVENOUS
  Administered 2015-02-14: 1 mg via INTRAVENOUS

## 2015-02-14 MED ORDER — SODIUM CHLORIDE 0.9 % IV SOLN: Freq: Once | INTRAVENOUS | Status: DC

## 2015-02-14 MED ORDER — PANTOPRAZOLE SODIUM 40 MG IV SOLR
8.0000 mg/h | INTRAVENOUS | Status: DC
  Administered 2015-02-14 – 2015-02-16 (×4): 8 mg/h via INTRAVENOUS
  Filled 2015-02-14 (×8): qty 80

## 2015-02-14 MED ORDER — SODIUM CHLORIDE 0.9 % IV SOLN
80.0000 mg | Freq: Once | INTRAVENOUS | Status: AC
  Administered 2015-02-14: 80 mg via INTRAVENOUS
  Filled 2015-02-14: qty 80

## 2015-02-14 NOTE — H&P (View-Only) (Signed)
Patient ID: Charles Luna, male   DOB: 1940-08-20, 75 y.o.   MRN: [id number]   Referring Provider: Dr. Aileen Fass Primary Care Physician:  Sherren Mocha, MD Primary Gastroenterologist:  Althia Forts  Reason for Consultation:  GI bleed  HPI: Charles Luna is a 75 y.o. male is being seen for a consult due to GI bleeding with melena daily for the past week. Black sticky stools occurred an unknown number of times each day. Denies associated nausea/vomiting/abdominal pain/dizziness/lightheadedness. Patient on radiation treatment for brain mets from metastatic lung cancer. Has been taking Advil once a day Q 6 hours for 2 days prior to admit. Hgb 9.7 (13.1 on 01/27/15). Hgb 7.7 today. Denies any previous colonoscopy or EGD. Denies any previous history of black stools. Wife and sister at bedside.   History reviewed. No pertinent past medical history.  Past Surgical History  Procedure Laterality Date  . Hemorroidectomy    . Left heart catheterization with coronary angiogram N/A 12/12/2010    Procedure: LEFT HEART CATHETERIZATION WITH CORONARY ANGIOGRAM;  Surgeon: Hillary Bow, MD;  Location: [facility] CATH LAB;  Service: Cardiovascular;  Laterality: N/A;  . Percutaneous coronary stent intervention (pci-s) N/A 12/12/2010    Procedure: PERCUTANEOUS CORONARY STENT INTERVENTION (PCI-S);  Surgeon: Hillary Bow, MD;  Location: [facility] CATH LAB;  Service: Cardiovascular;  Laterality: N/A;    Prior to Admission medications   Medication Sig Start Date End Date Taking? Authorizing Provider  aspirin 81 MG tablet Take 81 mg by mouth daily.   Yes Historical Provider, MD  dexamethasone (DECADRON) 4 MG tablet Take 1 tablet (4 mg total) by mouth 3 (three) times daily. Patient taking differently: Take 4 mg by mouth 2 (two) times daily.  01/27/15  Yes Volanda Napoleon, MD  hydrochlorothiazide (HYDRODIURIL) 25 MG tablet Take 1 tablet (25 mg total) by mouth daily. 03/10/14  Yes Sherren Mocha, MD  ibuprofen (ADVIL,MOTRIN) 200  MG tablet Take 200 mg by mouth every 6 (six) hours as needed for moderate pain.   Yes Historical Provider, MD  Loratadine (CLARITIN) 10 MG CAPS Take 10 mg by mouth daily as needed (allergies).    Yes Historical Provider, MD  metoprolol (LOPRESSOR) 50 MG tablet Take 1 tablet (50 mg total) by mouth 2 (two) times daily. 03/10/14  Yes Sherren Mocha, MD  naproxen sodium (ANAPROX) 220 MG tablet Take 220 mg by mouth daily as needed (pain).   Yes Historical Provider, MD  nitroGLYCERIN (NITROSTAT) 0.4 MG SL tablet Place 1 tablet (0.4 mg total) under the tongue every 5 (five) minutes as needed for chest pain. 03/10/14 06/07/16 Yes Sherren Mocha, MD  potassium chloride SA (K-DUR,KLOR-CON) 20 MEQ tablet Take 1 tablet (20 mEq total) by mouth daily. 03/10/14  Yes Sherren Mocha, MD  rosuvastatin (CRESTOR) 10 MG tablet Take 1 tablet (10 mg total) by mouth daily. 03/23/14  Yes Sherren Mocha, MD  senna (SENOKOT) 8.6 MG tablet Take 1 tablet by mouth daily.   Yes Historical Provider, MD  Wound Dressings (SONAFINE EX) Apply topically.    Historical Provider, MD    Scheduled Meds: . sodium chloride   Intravenous Once  . dexamethasone  4 mg Oral BID  . metoprolol  50 mg Oral BID  . pantoprazole (PROTONIX) IV  40 mg Intravenous Q12H  . protein supplement shake  11 oz Oral BID BM  . rosuvastatin  10 mg Oral Daily   Continuous Infusions: . sodium chloride 75 mL/hr at 02/12/15 1956   PRN Meds:.loratadine, morphine injection,  ondansetron **OR** ondansetron (ZOFRAN) IV  Allergies as of 02/12/2015 - Review Complete 02/12/2015  Allergen Reaction Noted  . Tylenol [acetaminophen] Shortness Of Breath 12/13/2010    Family History  Problem Relation Age of Onset  . Pancreatic cancer Mother   . Cancer Brother   . Cancer Maternal Uncle     Social History   Social History  . Marital Status: Married    Spouse Name: N/A  . Number of Children: 2  . Years of Education: N/A   Occupational History  . Electrician     Social History Main Topics  . Smoking status: Former Smoker -- 2.00 packs/day for 25 years    Types: Cigarettes    Quit date: 12/13/1990  . Smokeless tobacco: Never Used  . Alcohol Use: No  . Drug Use: No  . Sexual Activity: Yes   Other Topics Concern  . Not on file   Social History Narrative    Review of Systems: All negative except as stated above in HPI.  Physical Exam: Vital signs: Filed Vitals:   02/12/15 2040 02/13/15 0513  BP: 112/63 128/60  Pulse: 81 77  Temp: 97.8 F (36.6 C) 97.8 F (36.6 C)  Resp: 18 16   Last BM Date: 02/12/15 General:   Lethargic, pale, Well-developed, well-nourished, pleasant and cooperative in NAD Head: atraumatic Eyes: anicteric sclera ENT: oropharynx clear Neck: supple, nontender Lungs:  Clear throughout to auscultation.   No wheezes, crackles, or rhonchi. No acute distress. Heart:  Regular rate and rhythm; no murmurs, clicks, rubs,  or gallops. Abdomen: soft, nontender, nondistended, +BS  Rectal:  Deferred Ext: no edema Skin: no rash  GI:  Lab Results:  Recent Labs  02/12/15 1151 02/13/15 0426  WBC 8.8 6.6  HGB 9.7* 7.7*  HCT 29.4* 23.7*  PLT 144* 108*   BMET  Recent Labs  02/12/15 1151 02/13/15 0426  NA 135 136  K 3.8 4.2  CL 96* 101  CO2 25 26  GLUCOSE 214* 166*  BUN 28* 26*  CREATININE 0.77 0.73  CALCIUM 9.7 8.2*   LFT  Recent Labs  02/13/15 0426  PROT 4.9*  ALBUMIN 1.8*  AST 69*  ALT 76*  ALKPHOS 156*  BILITOT 1.0   PT/INR No results for input(s): LABPROT, INR in the last 72 hours.   Studies/Results: Dg Chest 2 View  02/12/2015  CLINICAL DATA:  Shortness of breath ; lung carcinoma EXAM: CHEST  2 VIEW COMPARISON:  PET-CT February 02, 2015; chest radiograph January 25, 2012 FINDINGS: There is an irregular nodular lesion in the right upper lobe measuring 2.1 x 1.0 cm, better appreciable on the recent PET study. Multiple subcentimeter nodular lesions seen on recent CT study are not well seen by  radiography. There is no appreciable edema or consolidation. Heart size and pulmonary vascularity are normal. There is right paratracheal region adenopathy. No bone lesions. IMPRESSION: Irregular nodular lesion right upper lobe, unchanged from recent PET study. No edema or consolidation. No change in cardiac silhouette. Right paratracheal adenopathy, again better delineated by CT. Electronically Signed   By: Lowella Grip III M.D.   On: 02/12/2015 12:50    Impression/Plan: Melena and severe anemia concerning for an upper GI bleed. Hemodynamically stable. Suspect peptic ulcer bleed with recent NSAID use. Agree with blood transfusion. EGD tomorrow to assess for a source of the melena. Clear liquid diet. NPO p MN. IV PPI Q 12 hours. Risks/benefits of the EGD discussed and he and his wife agree to proceed.  LOS: 1 day   [place] C.  02/13/2015, 1:08 PM  Pager [phone]  If no answer or after 5 PM call [phone]

## 2015-02-14 NOTE — Op Note (Addendum)
[place], [id number]   ENDOSCOPY PROCEDURE REPORT  PATIENT: Lohnes, Parvin Stetzer  MR#: [id number] BIRTHDATE: [birth year] , 74  yrs. old GENDER: male ENDOSCOPIST: Wilford Corner, MD REFERRED BY:  hospital team PROCEDURE DATE:  16-Mar-2015 PROCEDURE:  EGD, diagnostic ASA CLASS:     Class III INDICATIONS:  GI bleed; Abdominal pain. MEDICATIONS: Fentanyl 25 mcg IV and Versed 3 mg IV TOPICAL ANESTHETIC: none  DESCRIPTION OF PROCEDURE: After the risks benefits and alternatives of the procedure were thoroughly explained, informed consent was obtained.  The Pentax Gastroscope [id number] endoscope was introduced through the mouth and advanced to the second portion of the duodenum , Without limitations.  The instrument was slowly withdrawn as the mucosa was fully examined. Estimated blood loss is zero unless otherwise noted in this procedure report.    Esophagus and GEJ normal. Clear bilious fluid in stomach. Antrum of stomach normal. Duodenal bulb and 2nd portion of the duodenum normal.       Retroflexed views revealed a 7 mm ulcer seen with black eschar noted with raised edges and edema seen in the fundus. A 2 mm clean based ulcer with surrounding edema noted in the greater curvature of the stomach.     The scope was then withdrawn from the patient and the procedure completed.  COMPLICATIONS: There were no immediate complications.  ENDOSCOPIC IMPRESSION:     Fundic ulcer with bleeding stigmata - no active bleeding; Biopsies not taken due to risk of further bleeding Small ulcer in gastric body  RECOMMENDATIONS:     Protonix drip; Clear liquid diet; Follow H/Hs; Check H. pylori serology   eSigned:  Wilford Corner, MD 03-16-2015 10:55 AM Revised: 03-16-15 10:55 AM   CC:  CPT CODES: ICD CODES:  The ICD and CPT codes recommended by this software are interpretations from the data that the clinical staff has captured with the software.   The verification of the translation of this report to the ICD and CPT codes and modifiers is the sole responsibility of the health care institution and practicing physician where this report was generated.  [employer] will not be held responsible for the validity of the ICD and CPT codes included on this report.  AMA assumes no liability for data contained or not contained herein. CPT is a Designer, television/film set of the [employer].  PATIENT NAME:  Hartstein, Evon Lopezperez MR#: [id number]

## 2015-02-14 NOTE — Brief Op Note (Signed)
Two gastric ulcers with larger one of 7 mm in fundus and bleeding stigmata seen. See endopro note for details. Changed to Protonix drip. Clear liquid diet only today and if ok slowly advance tomorrow. Follow H/Hs. Check H. Pylori serology.

## 2015-02-14 NOTE — Interval H&P Note (Signed)
History and Physical Interval Note:  02/14/2015 9:22 AM  Charles Luna  has presented today for surgery, with the diagnosis of GI bleed  The various methods of treatment have been discussed with the patient and family. After consideration of risks, benefits and other options for treatment, the patient has consented to  Procedure(s): ESOPHAGOGASTRODUODENOSCOPY (EGD) (N/A) as a surgical intervention .  The patient's history has been reviewed, patient examined, no change in status, stable for surgery.  I have reviewed the patient's chart and labs.  Questions were answered to the patient's satisfaction.     [place] C.

## 2015-02-15 ENCOUNTER — Inpatient Hospital Stay (HOSPITAL_COMMUNITY): Payer: Medicare Other

## 2015-02-15 ENCOUNTER — Ambulatory Visit
Admission: RE | Admit: 2015-02-15 | Discharge: 2015-02-15 | Disposition: A | Discharge status: Home / Self Care | Payer: Medicare Other | Source: Ambulatory Visit | Attending: Radiation Oncology | Admitting: Radiation Oncology

## 2015-02-15 ENCOUNTER — Encounter (HOSPITAL_COMMUNITY): Payer: Self-pay | Admitting: Gastroenterology

## 2015-02-15 ENCOUNTER — Telehealth: Payer: Self-pay | Admitting: Oncology

## 2015-02-15 DIAGNOSIS — D500 Iron deficiency anemia secondary to blood loss (chronic): Secondary | ICD-10-CM

## 2015-02-15 DIAGNOSIS — K922 Gastrointestinal hemorrhage, unspecified: Secondary | ICD-10-CM

## 2015-02-15 DIAGNOSIS — K259 Gastric ulcer, unspecified as acute or chronic, without hemorrhage or perforation: Secondary | ICD-10-CM

## 2015-02-15 LAB — TYPE AND SCREEN
ABO/RH(D): O POS
Antibody Screen: NEGATIVE
UNIT DIVISION: 0

## 2015-02-15 LAB — GLUCOSE, CAPILLARY: GLUCOSE-CAPILLARY: 144 mg/dL — AB (ref 65–99)

## 2015-02-15 LAB — PROTIME-INR
INR: 1.13 (ref 0.00–1.49)
Prothrombin Time: 14.7 seconds (ref 11.6–15.2)

## 2015-02-15 LAB — H. PYLORI ANTIBODY, IGG

## 2015-02-15 MED ORDER — LIDOCAINE-EPINEPHRINE 2 %-1:100000 IJ SOLN
INTRAMUSCULAR | Status: AC
  Filled 2015-02-15: qty 1

## 2015-02-15 MED ORDER — SUCRALFATE 1 GM/10ML PO SUSP
1.0000 g | Freq: Three times a day (TID) | ORAL | Status: DC
  Administered 2015-02-15 – 2015-02-19 (×13): 1 g via ORAL
  Filled 2015-02-15 (×15): qty 10

## 2015-02-15 MED ORDER — MIDAZOLAM HCL 2 MG/2ML IJ SOLN
INTRAMUSCULAR | Status: AC
  Filled 2015-02-15: qty 4

## 2015-02-15 MED ORDER — MIDAZOLAM HCL 2 MG/2ML IJ SOLN
INTRAMUSCULAR | Status: AC | PRN
  Administered 2015-02-15: 0.5 mg via INTRAVENOUS

## 2015-02-15 MED ORDER — CEFAZOLIN SODIUM-DEXTROSE 2-3 GM-% IV SOLR
INTRAVENOUS | Status: AC
  Filled 2015-02-15: qty 50

## 2015-02-15 MED ORDER — FENTANYL CITRATE (PF) 100 MCG/2ML IJ SOLN
INTRAMUSCULAR | Status: AC
  Filled 2015-02-15: qty 2

## 2015-02-15 MED ORDER — DEXAMETHASONE 4 MG PO TABS
4.0000 mg | ORAL_TABLET | Freq: Every day | ORAL | Status: DC
  Administered 2015-02-15 – 2015-02-19 (×5): 4 mg via ORAL
  Filled 2015-02-15 (×6): qty 1

## 2015-02-15 MED ORDER — LIDOCAINE HCL 1 % IJ SOLN
INTRAMUSCULAR | Status: AC
  Filled 2015-02-15: qty 20

## 2015-02-15 MED ORDER — CEFAZOLIN SODIUM-DEXTROSE 2-3 GM-% IV SOLR
2.0000 g | INTRAVENOUS | Status: AC
  Administered 2015-02-15: 2 g via INTRAVENOUS

## 2015-02-15 NOTE — Progress Notes (Signed)
Received report from [facility] in [place].   He has a right port a cath that is accessed and clean.  Patient awake and oriented to person and place.  Vital signs taken.

## 2015-02-15 NOTE — Progress Notes (Signed)
Mr. Charles Luna is feeling a little bit better. He had an upper endoscopy over the weekend. It showed that he had a gastric ulcer. He is on steroids. He was taking a lot of Advil prior to admission.it is possible possible steroids may have contributed to this ulcer. I wish you try to cut down on the steroids and try get him off. As such, hewill be weaned down off the Decadron.  There is no blood work today. His hemoglobin was 9 yesterday. We will see what it is today. He may need be transfused again.  He is weak. He is feeling better. He wants to have regular food. I think this would be reasonable.  I think physical therapy also will help him quite a bit.  He also needs  Port-A-Cath. I think that if we go with chemotherapy, a Port-A-Cath will help.  His wife and sister were with [place] this morning. His wife just broke her left shoulder. As such, she really cannot help him right now.  His physical exam today shows stable vital signs. His blood pressure is 160/90.  On his physical exam, his lungs sound pretty clear. He has no oral lesions. Cardiac exam regular rate and rhythm with no murmurs rubs or bruits. Abdomen is soft. He has good bowel sounds. There is no fluid wave. There is no tenderness. Extremities shows no clubbing, cyanosis or edema.  Mr. Charles Luna has metastatic lung cancer. He came in with marked anemia secondary to GI bleeding. For now, trying to increase his diet will help. We will see what his lab work looks like. He may need to be transfused.  I know that he also was found. I still worry that there may be other sources of bleeding, mostly from him having widespread metastatic lung cancer. We will have to follow his hemoglobin closely.  If he still is having melena or bright red blood per rectum, he may need to have a colonoscopy.  physical therapy I think will help.  I do appreciate all the great care that he is getting from the staff on [place].  [place] 7:7

## 2015-02-15 NOTE — Progress Notes (Signed)
Chief Complaint: Patient was seen in consultation today for port placement at the request of Dr. Marin Olp  Referring Physician(s): Dr. Marin Olp  History of Present Illness: Charles Luna is a 75 y.o. male with metastatic lung cancer.  He was admitted for anemia and is worked up for GI bleed. Found to have two gastric ulcers. He received 1 unit of blood and Hgb is up to 9.0 He's feeling better but still a bit weak. Dr. Marin Olp has seen pt and discussed moving forward with chemotherapy and he will need port placed. Chart, PMHx, meds, labs reviewed. Had about 3-4oz grape juice this morning at ~0800, otherwise has been NPO   History reviewed. No pertinent past medical history.  Past Surgical History  Procedure Laterality Date  . Hemorroidectomy    . Left heart catheterization with coronary angiogram N/A 12/12/2010    Procedure: LEFT HEART CATHETERIZATION WITH CORONARY ANGIOGRAM;  Surgeon: Hillary Bow, MD;  Location: [facility] CATH LAB;  Service: Cardiovascular;  Laterality: N/A;  . Percutaneous coronary stent intervention (pci-s) N/A 12/12/2010    Procedure: PERCUTANEOUS CORONARY STENT INTERVENTION (PCI-S);  Surgeon: Hillary Bow, MD;  Location: [facility] CATH LAB;  Service: Cardiovascular;  Laterality: N/A;  . Esophagogastroduodenoscopy N/A 02/14/2015    Procedure: ESOPHAGOGASTRODUODENOSCOPY (EGD);  Surgeon: Wilford Corner, MD;  Location: [facility] ENDOSCOPY;  Service: Endoscopy;  Laterality: N/A;    Allergies: Tylenol  Medications:  Current facility-administered medications:  .  0.9 %  sodium chloride infusion, , Intravenous, Continuous, Robbie Lis, MD, Last Rate: 75 mL/hr at 02/13/15 2012 .  0.9 %  sodium chloride infusion, , Intravenous, Once, Charlynne Cousins, MD .  0.9 %  sodium chloride infusion, , Intravenous, Once, Wilford Corner, MD .  dexamethasone (DECADRON) tablet 4 mg, 4 mg, Oral, Daily, Volanda Napoleon, MD, 4 mg at 02/15/15 0831 .  loratadine (CLARITIN) tablet 10 mg,  10 mg, Oral, Daily PRN, Robbie Lis, MD .  metoprolol (LOPRESSOR) tablet 50 mg, 50 mg, Oral, BID, Robbie Lis, MD, 50 mg at 02/15/15 0831 .  morphine 2 MG/ML injection 1-2 mg, 1-2 mg, Intravenous, Q2H PRN, Robbie Lis, MD, 1 mg at 02/15/15 0831 .  ondansetron (ZOFRAN) tablet 4 mg, 4 mg, Oral, Q6H PRN **OR** ondansetron (ZOFRAN) injection 4 mg, 4 mg, Intravenous, Q6H PRN, Robbie Lis, MD .  pantoprazole (PROTONIX) 80 mg in sodium chloride 0.9 % 250 mL (0.32 mg/mL) infusion, 8 mg/hr, Intravenous, Continuous, Wilford Corner, MD, Last Rate: 25 mL/hr at 02/14/15 2156, 8 mg/hr at 02/14/15 2156 .  [START ON 02/17/2015] pantoprazole (PROTONIX) injection 40 mg, 40 mg, Intravenous, Q12H, Wilford Corner, MD .  protein supplement (PREMIER PROTEIN) liquid, 11 oz, Oral, BID BM, Asencion Islam, RD, 11 oz at 02/15/15 1000 .  rosuvastatin (CRESTOR) tablet 10 mg, 10 mg, Oral, Daily, Robbie Lis, MD, 10 mg at 02/15/15 0831    Family History  Problem Relation Age of Onset  . Pancreatic cancer Mother   . Cancer Brother   . Cancer Maternal Uncle     Social History   Social History  . Marital Status: Married    Spouse Name: N/A  . Number of Children: 2  . Years of Education: N/A   Occupational History  . Electrician    Social History Main Topics  . Smoking status: Former Smoker -- 2.00 packs/day for 25 years    Types: Cigarettes    Quit date: 12/13/1990  . Smokeless tobacco: Never Used  .  Alcohol Use: No  . Drug Use: No  . Sexual Activity: Yes   Other Topics Concern  . None   Social History Narrative     Review of Systems: A 12 point ROS discussed and pertinent positives are indicated in the HPI above.  All other systems are negative.  Review of Systems  Vital Signs: BP 160/90 mmHg  Pulse 90  Temp(Src) 98 F (36.7 C) (Oral)  Resp 20  Ht '5\' 10"'$  (1.778 m)  Wt 160 lb 7.9 oz (72.8 kg)  BMI 23.03 kg/m2  SpO2 98%  Physical Exam  Constitutional: He is oriented to person,  place, and time. He appears well-developed and well-nourished. No distress.  HENT:  Head: Normocephalic.  Mouth/Throat: Oropharynx is clear and moist.  Neck: Normal range of motion. No tracheal deviation present.  Cardiovascular: Normal rate, regular rhythm and normal heart sounds.   Pulmonary/Chest: Effort normal and breath sounds normal. No respiratory distress.  Abdominal: Soft. He exhibits no distension.  Musculoskeletal: Normal range of motion.  Neurological: He is alert and oriented to person, place, and time.  Psychiatric: He has a normal mood and affect. Judgment normal.    Mallampati Score:  MD Evaluation Airway: WNL Heart: WNL Abdomen: WNL Chest/ Lungs: WNL ASA  Classification: 3 Mallampati/Airway Score: Two  Imaging: Dg Chest 2 View  02/12/2015  CLINICAL DATA:  Shortness of breath ; lung carcinoma EXAM: CHEST  2 VIEW COMPARISON:  PET-CT February 02, 2015; chest radiograph January 25, 2012 FINDINGS: There is an irregular nodular lesion in the right upper lobe measuring 2.1 x 1.0 cm, better appreciable on the recent PET study. Multiple subcentimeter nodular lesions seen on recent CT study are not well seen by radiography. There is no appreciable edema or consolidation. Heart size and pulmonary vascularity are normal. There is right paratracheal region adenopathy. No bone lesions. IMPRESSION: Irregular nodular lesion right upper lobe, unchanged from recent PET study. No edema or consolidation. No change in cardiac silhouette. Right paratracheal adenopathy, again better delineated by CT. Electronically Signed   By: Lowella Grip III M.D.   On: 02/12/2015 12:50   Mr Jeri Cos OQ Contrast  02/03/2015  CLINICAL DATA:  Recent diagnosis of lung cancer.  Staging. EXAM: MRI HEAD WITHOUT AND WITH CONTRAST TECHNIQUE: Multiplanar, multiecho pulse sequences of the brain and surrounding structures were obtained without and with intravenous contrast. CONTRAST:  49m MULTIHANCE GADOBENATE  DIMEGLUMINE 529 MG/ML IV SOLN COMPARISON:  None. FINDINGS: There are on the order of 70 to 90 metastatic lesions scattered throughout the cerebellum and both cerebral hemispheres. Most of these are 3 mm or smaller in size. In the cerebellum, the largest lesion is in the inferior left cerebellum measuring 6 mm. No significant edema or mass effect. In the right cerebral hemisphere, the largest lesion is in the right posterior parietal region measuring 15 mm in diameter with mild vasogenic edema. In the left cerebral hemisphere, the largest lesion is at the left parietal vertex measuring 11 mm in diameter with mild vasogenic edema. None of the lesions show gross hemorrhage. There is no hydrocephalus. No extra-axial collection. No widespread leptomeningeal enhancement. No skullbase metastasis seen. No pituitary mass. No inflammatory sinus disease. IMPRESSION: Innumerable (70-90) metastatic lesions scattered throughout the brain. Largest cerebellar lesion measures 6 mm. Largest right hemispheric lesion measures 15 mm. Largest left hemispheric lesion measures 11 mm. No pronounced vasogenic edema. Mild vasogenic edema associated with the larger lesions. No mass effect or shift. No hemorrhage. Electronically Signed  By: Nelson Chimes M.D.   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 EXAM: NUCLEAR MEDICINE PET SKULL BASE TO THIGH TECHNIQUE: 9.7 mCi F-18 FDG was injected intravenously. Full-ring PET imaging was performed from the skull base to thigh after the radiotracer. CT data was obtained and used for attenuation correction and anatomic localization. FASTING BLOOD GLUCOSE:  Value: 140 mg/dl COMPARISON:  None. FINDINGS: NECK Sub-cm hypermetabolic lymph nodes seen in the supraclavicular regions bilaterally. CHEST Dominant multi lobulated mass in the right upper lobe measuring 2.5 cm is hypermetabolic,  with SUV max of 12.3. Multiple adjacent satellite nodules in the right upper lobe are also hypermetabolic. There also scattered sub-cm pulmonary nodules seen in both lungs, which are too numerous to count. Multiple show metabolic activity, onsistent with bilateral pulmonary metastases. Hypermetabolic lymphadenopathy is seen in right hilum, with SUV max of 20.0. Hypermetabolic lymphadenopathy is also seen in the mediastinum in the right paratracheal and subcarinal regions. SUV max of right paratracheal lymphadenopathy is 17.9. Mild hypermetabolic adenopathy is also seen in both axillary regions. Multiple small hypermetabolic foci are also seen within bilateral chest wall musculature and subcutaneous tissues which are too numerous to count, consistent with chest wall soft tissue metastases. ABDOMEN/PELVIS Diffuse hypermetabolic liver metastases are seen throughout the right and left lobes. Mild hypermetabolic lymphadenopathy seen within the upper abdomen and small bowel mesentery, consistent with metastatic disease. There are also multiple small hypermetabolic peritoneal nodules seen in the abdomen and pelvis, consistent with metastatic lymph nodes or peritoneal implants. Multiple small hypermetabolic metastases are seen throughout the iliopsoas muscles and bilateral gluteal muscles bilaterally. Several small hypermetabolic metastases are seen within subcutaneous tissues of the abdominal wall bilaterally. SKELETON Widespread hypermetabolic bone metastases are seen involving the pelvis, spine, and several bilateral ribs. IMPRESSION: Dominant hypermetabolic right upper lobe mass, consistent with primary bronchogenic carcinoma. Widespread metastatic disease throughout the neck, chest, abdomen, and pelvis as described above. In addition to widespread metastatic lymphadenopathy and diffuse liver metastases, there is widespread skeletal and body wall (intramuscular and subcutaneous) metastases. Electronically Signed   By:  Earle Gell M.D.   On: 02/02/2015 15:34    Labs:  CBC:  Recent Labs  01/27/15 1152 02/12/15 1151 02/13/15 0426 02/14/15 1050  WBC 18.9* 8.8 6.6 7.6  HGB 13.1 9.7* 7.7* 9.0*  HCT 39.0 29.4* 23.7* 27.8*  PLT 233 144* 108* 106*    BMP:  Recent Labs  03/04/14 0743 01/27/15 1152 02/12/15 1151 02/13/15 0426  NA 138 138 135 136  K 3.7 4.9 3.8 4.2  CL 103  --  96* 101  CO2 '31 28 25 26  '$ GLUCOSE 103* 119 214* 166*  BUN 13 26.1* 28* 26*  CALCIUM 10.3 10.3 9.7 8.2*  CREATININE 0.81 0.8 0.77 0.73  GFRNONAA  --   --  >60 >60  GFRAA  --   --  >60 >60    LIVER FUNCTION TESTS:  Recent Labs  03/12/14 0736 01/27/15 1152 02/13/15 0426  BILITOT 0.9 0.96 1.0  AST 19 35* 69*  ALT 15 74* 76*  ALKPHOS 70 111 156*  PROT 7.1 7.0 4.9*  ALBUMIN 3.7 3.0* 1.8*    TUMOR MARKERS:  Recent Labs  01/27/15 1152  CEA <0.5    Assessment and Plan: Metastatic lung cancer For port placement Risks and Benefits discussed with the patient including, but not limited to bleeding,  infection, pneumothorax, or fibrin sheath development and need for additional procedures. All of the patient's questions were answered, patient is agreeable to proceed. Consent signed and in chart. Labs reviewed, hgb/VS stable   Thank you for this interesting consult.   A copy of this report was sent to the requesting provider on this date.  Electronically Signed: Ascencion Dike 02/15/2015, 9:28 AM   I spent a total of 20 minutes in face to face in clinical consultation, greater than 50% of which was counseling/coordinating care for port placement

## 2015-02-15 NOTE — Telephone Encounter (Signed)
Lynnae Sandhoff, RN on [address] for report on Mr. Emanuelson.  Per Maudie Mercury, he is OK to have radiation today.  He is going to have a port a cath placed at 12:30 today so he can have radiation after that.  Called Faith, RT on [address] and patient is scheduled for 2:25 pm.

## 2015-02-15 NOTE — Procedures (Signed)
Successful RT IJ POWER PORT TIP SVC/RA NO COMP READY FOR USE

## 2015-02-15 NOTE — Progress Notes (Signed)
I called and spoke with Maudie Mercury RN on 3W about Mr. Derrington receiving his radiation today. She informed me that Mr. Buckalew was not back yet from his [facility] placement. I then called Interventional Radiology to check on the timing of Mr. Nordby. Per the person I spoke with, he is just beginning his procedure. I gave her my number, and she will call me when Mr. Fuentes finishes so that he can then receive radiation. I called [facility] and spoke to Merrilee Seashore to inform him of Mr. Kratochvil location and that we will call when he is ready to receive radiation.

## 2015-02-15 NOTE — Discharge Instructions (Signed)

## 2015-02-15 NOTE — Progress Notes (Signed)
[facility] PROGRESS NOTE    Progress Note   Mehul Rudin Kirsh MBW:[id number] DOB: [birth year] DOA: 02/12/2015 PCP: Sherren Mocha, MD   Brief Narrative:   Charles Luna is an 75 y.o. male medical history significant for asbestosis Bowsher, 50-pack-year history of tobacco use, stopped about 25 years ago. He was seen by Dr. Marin Olp of oncology earlier in January of this year and this is for follow-up on his finding of metastatic brain cancer.   Assessment/Plan:  Generalized weakness/dehydration, acute blood loss anemia/normocytic anemia/normocytic anemia In the setting of metastatic lung cancer with ongoing radiation and drop hemoglobin from 13 on 01/27/2015 to 7.7 on 02/13/2015. Transfused 1 U PRBC- Hb improved to 9.0 At home he is on aspirin and NSAIDs and steroids- FOBT positive - EGD reveals PUD Started on PPI twice a day  Primary lung cancer with metastatic brain lesions: Patient will resume radiation therapy and receive port today  Thrombocytopenia: In the setting of malignancy. Continue to trend.  Essential hypertension: Seems to be fairly control continue metoprolol.  DVT Prophylaxis - Place SCDs  Family Communication: wife Disposition Plan: likely will need SNF Code Status:     Code Status Orders        Start     Ordered   02/12/15 1425  Do not attempt resuscitation (DNR)   Continuous    Question Answer Comment  In the event of cardiac or respiratory ARREST Do not call a "code blue"   In the event of cardiac or respiratory ARREST Do not perform Intubation, CPR, defibrillation or ACLS   In the event of cardiac or respiratory ARREST Use medication by any route, position, wound care, and other measures to relive pain and suffering. May use oxygen, suction and manual treatment of airway obstruction as needed for comfort.      02/12/15 1424    Code Status History    Date Active Date Inactive Code Status Order ID Comments User Context   02/12/2015 12:58 PM  02/12/2015  2:24 PM DNR [id number]  Orlie Dakin, MD ED        IV Access:    Peripheral IV   Procedures and diagnostic studies:   No results found.   Medical Consultants:    None.  Anti-Infectives:   Anti-infectives    Start     Dose/Rate Route Frequency Ordered Stop   02/15/15 1249  ceFAZolin (ANCEF) 2-3 GM-% IVPB SOLR    Comments:  Hunt, Rebecca   : cabinet override      02/15/15 1249 02/16/15 0059   02/15/15 1145  ceFAZolin (ANCEF) IVPB 2 g/50 mL premix    Comments:  Send with pt to Radiology for procedure   2 g 100 mL/hr over 30 Minutes Intravenous On call 02/15/15 0953 02/16/15 1145      Subjective:    Kier A Shanks complains of severe weakness. No pain nausea, vomiting or constipation.   Objective:    Filed Vitals:   02/14/15 1447 02/14/15 2154 02/15/15 0531 02/15/15 1313  BP: 115/52 172/80 160/90 125/71  Pulse: 87 113 90 70  Temp: 97.5 F (36.4 C) 97.5 F (36.4 C) 98 F (36.7 C)   TempSrc: Axillary Oral    Resp: '18 20 20 19  '$ Height:      Weight:      SpO2: 99% 98% 98% 100%    Intake/Output Summary (Last 24 hours) at 02/15/15 1334 Last data filed at 02/15/15 0535  Gross per 24 hour  Intake  0 ml  Output    450 ml  Net   -450 ml   Filed Weights   02/12/15 1124 02/12/15 1424 02/13/15 0513  Weight: 69.4 kg (153 lb) 69.3 kg (152 lb 12.5 oz) 72.8 kg (160 lb 7.9 oz)    Exam: Gen:  NAD, AAO x 3 Cardiovascular:  RRR.  Chest and lungs:   Good air movement clear to auscultation Abdomen:  Abdomen soft, NT/ND, + BS Extremities:  No edema.   Data Reviewed:    Labs: Basic Metabolic Panel:  Recent Labs Lab 02/12/15 1151 02/13/15 0426  NA 135 136  K 3.8 4.2  CL 96* 101  CO2 25 26  GLUCOSE 214* 166*  BUN 28* 26*  CREATININE 0.77 0.73  CALCIUM 9.7 8.2*   GFR Estimated Creatinine Clearance: 83.4 mL/min (by C-G formula based on Cr of 0.73). Liver Function Tests:  Recent Labs Lab 02/13/15 0426  AST 69*  ALT 76*  ALKPHOS  156*  BILITOT 1.0  PROT 4.9*  ALBUMIN 1.8*   No results for input(s): LIPASE, AMYLASE in the last 168 hours. No results for input(s): AMMONIA in the last 168 hours. Coagulation profile  Recent Labs Lab 02/15/15 0905  INR 1.13    CBC:  Recent Labs Lab 02/12/15 1151 02/13/15 0426 02/14/15 1050  WBC 8.8 6.6 7.6  HGB 9.7* 7.7* 9.0*  HCT 29.4* 23.7* 27.8*  MCV 89.4 90.1 85.5  PLT 144* 108* 106*   Cardiac Enzymes: No results for input(s): CKTOTAL, CKMB, CKMBINDEX, TROPONINI in the last 168 hours. BNP (last 3 results) No results for input(s): PROBNP in the last 8760 hours. CBG:  Recent Labs Lab 02/12/15 1145 02/13/15 0741 02/14/15 0758 02/15/15 0749  GLUCAP 177* 159* 135* 144*   D-Dimer: No results for input(s): DDIMER in the last 72 hours. Hgb A1c: No results for input(s): HGBA1C in the last 72 hours. Lipid Profile: No results for input(s): CHOL, HDL, LDLCALC, TRIG, CHOLHDL, LDLDIRECT in the last 72 hours. Thyroid function studies: No results for input(s): TSH, T4TOTAL, T3FREE, THYROIDAB in the last 72 hours.  Invalid input(s): FREET3 Anemia work up: No results for input(s): VITAMINB12, FOLATE, FERRITIN, TIBC, IRON, RETICCTPCT in the last 72 hours. Sepsis Labs:  Recent Labs Lab 02/12/15 1151 02/13/15 0426 02/14/15 1050  WBC 8.8 6.6 7.6   Microbiology No results found for this or any previous visit (from the past 240 hour(s)).   Medications:   . sodium chloride   Intravenous Once  . sodium chloride   Intravenous Once  . ceFAZolin      .  ceFAZolin (ANCEF) IV  2 g Intravenous On Call  . dexamethasone  4 mg Oral Daily  . fentaNYL      . lidocaine      . lidocaine-EPINEPHrine      . metoprolol  50 mg Oral BID  . midazolam      . [START ON 02/17/2015] pantoprazole (PROTONIX) IV  40 mg Intravenous Q12H  . protein supplement shake  11 oz Oral BID BM  . rosuvastatin  10 mg Oral Daily  . sucralfate  1 g Oral TID WC & HS   Continuous Infusions: .  sodium chloride 75 mL/hr at 02/13/15 2012  . pantoprozole (PROTONIX) infusion 8 mg/hr (02/14/15 2156)    Time spent: 25 min   LOS: 3 days   Debbe Odea, MD  [facility] Pager [phone]  *Please refer to [email or web address], password [email or web address] to get updated schedule on who will round on this patient,  as hospitalists switch teams weekly. If 7PM-7AM, please contact night-coverage at [email or web address], password [email or web address] for any overnight needs.  02/15/2015, 1:34 PM

## 2015-02-15 NOTE — Care Management Important Message (Signed)
Important Message  Patient Details  Name: Charles Luna MRN: [id number] Date of Birth: 05/10/1940   Medicare Important Message Given:  Yes    Camillo Flaming 02/15/2015, 12:05 [place] Message  Patient Details  Name: Charles Luna MRN: [id number] Date of Birth: [birth year]   Medicare Important Message Given:  Yes    Camillo Flaming 02/15/2015, 12:05 PM

## 2015-02-15 NOTE — Progress Notes (Signed)
Appropriate post-Tx rise in hgb as of yest; no BM's x at least 48hrs.  For PAC placement later today.  RECOMM:  1.  Will add sucralfate to regimen; would continue this at least until he goes home, and perhaps for a week or so post dischg 2.  Ok to transition to oral PPI tomorrow--would use pantoprazole 40 bid 1/2 hr before food and would stay on that indefinitely or until ulcer is confirmed to have healed  3.  F/u ov w/ Dr. Michail Sermon Feb. 23  9:30 am to discuss possible f/u egd to confirm ulcer healing  (the appropriateness of that exam will depend on pt's clinical course w/ respect to GI bleeding and his overall status) 4. If possible, would try to avoid resumption of ASA and Naproxen which he was on prior to adm 5. OK to advance diet from GI standpoint once he's back from today's [facility] placement--pt seems interested in grits  --??full liq diet   ??dietician consult for food preferences?  6.  Will re-check labs in a.m.   Cleotis Nipper, M.D. Pager [phone] If no answer or after 5 PM call [phone]

## 2015-02-16 ENCOUNTER — Ambulatory Visit
Admission: RE | Admit: 2015-02-16 | Discharge: 2015-02-16 | Disposition: A | Discharge status: Home / Self Care | Payer: Medicare Other | Source: Ambulatory Visit | Attending: Radiation Oncology | Admitting: Radiation Oncology

## 2015-02-16 ENCOUNTER — Encounter: Payer: Self-pay | Admitting: Radiation Oncology

## 2015-02-16 VITALS — BP 118/62 | HR 86 | Temp 97.7°F | Resp 16 | Ht 70.0 in

## 2015-02-16 DIAGNOSIS — R1013 Epigastric pain: Secondary | ICD-10-CM

## 2015-02-16 DIAGNOSIS — Z515 Encounter for palliative care: Secondary | ICD-10-CM

## 2015-02-16 DIAGNOSIS — C7931 Secondary malignant neoplasm of brain: Secondary | ICD-10-CM

## 2015-02-16 DIAGNOSIS — C3492 Malignant neoplasm of unspecified part of left bronchus or lung: Secondary | ICD-10-CM

## 2015-02-16 DIAGNOSIS — C3490 Malignant neoplasm of unspecified part of unspecified bronchus or lung: Secondary | ICD-10-CM

## 2015-02-16 LAB — BASIC METABOLIC PANEL
Anion gap: 7 (ref 5–15)
BUN: 17 mg/dL (ref 6–20)
CHLORIDE: 106 mmol/L (ref 101–111)
CO2: 24 mmol/L (ref 22–32)
Calcium: 8.3 mg/dL — ABNORMAL LOW (ref 8.9–10.3)
Creatinine, Ser: 0.75 mg/dL (ref 0.61–1.24)
GFR calc Af Amer: >60 mL/min (ref >60)
GFR calc non Af Amer: >60 mL/min (ref >60)
Glucose, Bld: 125 mg/dL — ABNORMAL HIGH (ref 65–99)
POTASSIUM: 4.7 mmol/L (ref 3.5–5.1)
SODIUM: 137 mmol/L (ref 135–145)

## 2015-02-16 LAB — CBC
HCT: 29.1 % — ABNORMAL LOW (ref 39.0–52.0)
HEMOGLOBIN: 9.4 g/dL — AB (ref 13.0–17.0)
MCH: 28.5 pg (ref 26.0–34.0)
MCHC: 32.3 g/dL (ref 30.0–36.0)
MCV: 88.2 fL (ref 78.0–100.0)
Platelets: 113 10*3/uL — ABNORMAL LOW (ref 150–400)
RBC: 3.3 MIL/uL — AB (ref 4.22–5.81)
RDW: 15.4 % (ref 11.5–15.5)
WBC: 8.3 10*3/uL (ref 4.0–10.5)

## 2015-02-16 LAB — GLUCOSE, CAPILLARY: Glucose-Capillary: 148 mg/dL — ABNORMAL HIGH (ref 65–99)

## 2015-02-16 MED ORDER — HYDROCODONE-ACETAMINOPHEN 5-325 MG PO TABS: 1.0000 | ORAL_TABLET | ORAL | Status: DC | PRN

## 2015-02-16 MED ORDER — OXYCODONE HCL 5 MG PO TABS
5.0000 mg | ORAL_TABLET | Freq: Four times a day (QID) | ORAL | Status: DC | PRN
  Administered 2015-02-16 – 2015-02-17 (×3): 5 mg via ORAL
  Filled 2015-02-16 (×5): qty 1

## 2015-02-16 MED ORDER — PANTOPRAZOLE SODIUM 40 MG PO TBEC
40.0000 mg | DELAYED_RELEASE_TABLET | Freq: Two times a day (BID) | ORAL | Status: DC
  Administered 2015-02-16 – 2015-02-18 (×5): 40 mg via ORAL
  Filled 2015-02-16 (×5): qty 1

## 2015-02-16 NOTE — Progress Notes (Signed)
  Radiation Oncology         [phone] ________________________________  Name: Charles Luna MRN: [id number]  Date: 02/16/2015  DOB: 06-Feb-1940  Weekly Radiation Therapy Management - In Patient    ICD-9-CM ICD-10-CM   1. Lung cancer, primary, with metastasis from lung to other site, unspecified laterality ([place]) 162.9 C34.90   2. Brain metastasis ([facility]) 198.3 C79.31      Current Dose: 15 Gy     Planned Dose:  35 Gy  Narrative . . . . . . . . The patient presents for routine under treatment assessment.                                   The patient is without complaint. He continues to be admitted for management of his gastric ulcers. He was admitted with severe anemia and fatigue. He continues to have blurred vision and notes some slight weakness in his right grip strength. He continues to have general overall fatigue and will tentatively be discharged to a rehabilitation center tomorrow. In light of patient's wife's arm problems is not felt that he would be safe going home at this time.                                 Set-up films were reviewed.                                 The chart was checked. Physical Findings. . .  height is '5\' 10"'$  (1.778 m). His temperature is 97.7 F (36.5 C). His blood pressure is 118/62 and his pulse is 86. His respiration is 16 and oxygen saturation is 97%. . Patient is lying comfortably in his bed. His lower motor strength appears to be 5 out of 5 in the proximal and distal muscle groups.  Impression . . . . . . . The patient is tolerating radiation. Plan . . . . . . . . . . . . Continue treatment as planned.  ________________________________   Blair Promise, PhD, MD

## 2015-02-16 NOTE — Progress Notes (Signed)
Hgb stable.  Please see my note from yesterday--but in the meantime, I have spoken w/ Dr. Michail Sermon, who feels that GI follow-up can be prn, so I have cancelled the patient's Feb 23rd appt w/ Dr. Michail Sermon that I had made yesterday.  Would favor bid PPI for 1 month following dischg, and then would probably continue qd PPI indefinitely thereafter as prophylaxis, especially if pt needs to go back on ASA or NSAID's.  Will sign off; please call if we can be of further assistance with this patient.   Cleotis Nipper, M.D. Pager [phone] If no answer or after 5 PM call [phone]

## 2015-02-16 NOTE — Evaluation (Signed)
Physical Therapy Evaluation Patient Details Name: Charles Luna MRN: [id number] DOB: August 19, 1940 Today's Date: 02/16/2015   History of Present Illness  75 y.o. male with h/o lung cancer with mets to brain who is undergoing daily radiation treatments admitted with GIB, anemia, dehydration, malnutrition.  Clinical Impression  Pt admitted with above diagnosis. Pt currently with functional limitations due to the deficits listed below (see PT Problem List). Pt ambulated 20' with RW, distance limited by dyspnea and BLE fatigue. SaO2 97% on RA walking. He is significantly deconditioned.  Pt will benefit from skilled PT to increase their independence and safety with mobility to allow discharge to the venue listed below.       Follow Up Recommendations Home health PT    Equipment Recommendations  Wheelchair (measurements PT);3in1 (PT)    Recommendations for Other Services       Precautions / Restrictions Precautions Precautions: Fall Precaution Comments: pt denies h/o falls Restrictions Weight Bearing Restrictions: No      Mobility  Bed Mobility Overal bed mobility: Modified Independent             General bed mobility comments: using rail, HOB up 20*  Transfers Overall transfer level: Needs assistance Equipment used: Rolling walker (2 wheeled) Transfers: Sit to/from Stand Sit to Stand: Min guard         General transfer comment: min/guard for safety  Ambulation/Gait Ambulation/Gait assistance: Supervision Ambulation Distance (Feet): 20 Feet Assistive device: Rolling walker (2 wheeled) Gait Pattern/deviations: Step-through pattern;Trunk flexed   Gait velocity interpretation: at or above normal speed for age/gender General Gait Details: distance limited by fatigue and respiratory congestion, 3/4 dyspnea, SaO2 97% on RA  Stairs            Wheelchair Mobility    Modified Rankin (Stroke Patients Only)       Balance Overall balance assessment: Modified  Independent                                           Pertinent Vitals/Pain Pain Assessment: No/denies pain    Home Living Family/patient expects to be discharged to:: Private residence Living Arrangements: Spouse/significant other Available Help at Discharge: Family;Available 24 hours/day Type of Home: House Home Access: Stairs to enter   [employer] of Steps: 4 Home Layout: One level Home Equipment: Walker - 2 wheels Additional Comments: pt's wife has an injured shoulder and cannot presently drive, pt's brother and sister have been driving him to radiation    Prior Function Level of Independence: Independent with assistive device(s)         Comments: walks with RW, pt reported he was getting very fatigued from daily trips to cancer center     Hand Dominance        Extremity/Trunk Assessment   Upper Extremity Assessment: Generalized weakness (knee ext -4/5 Bilaterally)           Lower Extremity Assessment: Generalized weakness (knee extension -4/5 Bilaterallyl)      Cervical / Trunk Assessment: Normal  Communication   Communication: No difficulties  Cognition Arousal/Alertness: Awake/alert Behavior During Therapy: WFL for tasks assessed/performed Overall Cognitive Status: Within Functional Limits for tasks assessed                      General Comments      Exercises        Assessment/Plan  PT Assessment Patient needs continued PT services  PT Diagnosis Difficulty walking;Generalized weakness   PT Problem List Decreased strength;Decreased activity tolerance;Decreased mobility;Cardiopulmonary status limiting activity  PT Treatment Interventions Gait training;Stair training;Functional mobility training;Therapeutic activities;Patient/family education;Therapeutic exercise   PT Goals (Current goals can be found in the Care Plan section) Acute Rehab PT Goals Patient Stated Goal: to get my legs stronger PT Goal  Formulation: With patient Time For Goal Achievement: 03/02/15 Potential to Achieve Goals: Good    Frequency Min 3X/week   Barriers to discharge Decreased caregiver support wife has injured shoulder and cannot drive    Co-evaluation               End of Session Equipment Utilized During Treatment: Gait belt Activity Tolerance: Patient limited by fatigue Patient left: in bed;with call bell/phone within reach;with family/visitor present Nurse Communication: Mobility status         Time: 9233-0076 PT Time Calculation (min) (ACUTE ONLY): 15 min   Charges:   PT Evaluation $PT Eval Low Complexity: 1 Procedure     PT G Codes:        Philomena Doheny 02/16/2015, 10:11 AM [phone]

## 2015-02-16 NOTE — Progress Notes (Signed)
CSW had received referral for New SNF.   Per chart review, PT recommending Home Health PT.   SNF not appropriate at this time.  Inappropriate CSW referral.   CSW notified RNCM that PT recommending Home Health PT.  No social work needs identified.  CSW signing off.   Alison Murray, MSW, [facility] [phone]

## 2015-02-16 NOTE — Progress Notes (Signed)
Charles Luna is doing better. He had a Port-A-Cath placed yesterday. I am very, very much impressed with radiology's rapid response.  He still feels weak. He's eating a little bit better. He has not noted any obvious bleeding.  He's not yet seen physical therapy.  He is getting out of bed to chair. He still needs a lot of help.I'm sure physical therapy will help with that.  He did go have Radiation therapy yesterday. He'll continue this while in the hospital.  He is urinating quite a bit. There is no dysuria. This probably is from the IV fluids.  He's not had any labs at today. We will see what the labs look like tomorrow.  There's been no problems with fever. He's had no cough. He's had no obvious pain.   On his physical exam, his vital signs stable. His  Temperature is 97.2. Pulse is 83. Blood pressure 127/56. Head and neck exam shows no ocular or oral lesions. There are no palpable cervical or supraclavicular lymph nodes. Lungs are clear. Cardiac exam regular rate and rhythm with an occasional extra beat. He has no murmurs, rubs or bruits. Abdomen is soft. He has decent bowel sounds. There is no fluid wave. There is no palpable liver or spleen tip.extremities shows no clubbing, cyanosis or edema. Neurological exam shows no focal neurological deficits.  For now, I the physical therapy will be critical for him.  We'll have to see what his labs look like tomorrow.   It is hard to tell if he is still having any GI bleeding.we will switch his IV PPI to oral.  As always, he is getting outstanding care from the staff upon [place] [facility].  Acts 17:28

## 2015-02-16 NOTE — Progress Notes (Signed)
Charles Luna has received 6 fractions to his brain. He reports decrease in blurred vision and note weakness of right grips as compared to the left.  Travel by bed from IP unit today.  IV in right antecubital intact with 09. NS at a rate of 45m/hr.

## 2015-02-16 NOTE — Progress Notes (Signed)
[facility] PROGRESS NOTE    Progress Note   [name] DOB: [birth year] DOA: 02/12/2015 PCP: Sherren Mocha, MD   Brief Narrative:   Charles Luna is an 75 y.o. male medical history significant for asbestosis Bowsher, 50-pack-year history of tobacco use, stopped about 25 years ago. He was seen by Dr. Marin Olp of oncology earlier in January of this year and this is for follow-up on his finding of metastatic brain cancer.   Assessment/Plan:  Generalized weakness/dehydration, acute blood loss anemia/normocytic anemia/normocytic anemia In the setting of metastatic lung cancer with ongoing radiation and drop hemoglobin from 13 on 01/27/2015 to 7.7 on 02/13/2015. Transfuse 1 unit of packed red blood cells on 02/13/2015 his hemoglobin continues to trend up. He status post EGD showed a peptic ulcer disease. He'll continue Protonix twice a day for 4 weeks then continue Protonix indefinitely. Appreciate GIs assistance. Physical therapy evaluated the patient recommended H/H. Change Protonix to twice a day.  Primary lung cancer with metastatic brain lesions: Cont radiation therapy, port in place. Follow-up with oncology as an outpatient.  Thrombocytopenia: In the setting of malignancy. Improving slowly.  Essential hypertension: Seems to be fairly control continue metoprolol.     DVT Prophylaxis - Place SCDs  Family Communication: wife Disposition Plan: Home in am Code Status:     Code Status Orders        Start     Ordered   02/12/15 1425  Do not attempt resuscitation (DNR)   Continuous    Question Answer Comment  In the event of cardiac or respiratory ARREST Do not call a "code blue"   In the event of cardiac or respiratory ARREST Do not perform Intubation, CPR, defibrillation or ACLS   In the event of cardiac or respiratory ARREST Use medication by any route, position, wound care, and other measures to relive pain and suffering. May use oxygen, suction  and manual treatment of airway obstruction as needed for comfort.      02/12/15 1424    Code Status History    Date Active Date Inactive Code Status Order ID Comments User Context   02/12/2015 12:58 PM 02/12/2015  2:24 PM DNR [id number]  Orlie Dakin, MD ED        IV Access:    Peripheral IV   Procedures and diagnostic studies:   Ir Fluoro Guide Cv Line Right  02/15/2015  CLINICAL DATA:  Metastatic lung carcinoma EXAM: RIGHT INTERNAL JUGULAR SINGLE LUMEN POWER PORT CATHETER INSERTION Date:  1/23/20171/23/2017 2:01 pm Radiologist:  M. Daryll Brod, MD Guidance:  ULTRASOUND AND FLUOROSCOPIC FLUOROSCOPY TIME:  24 SECONDS, 2.5 MGY MEDICATIONS AND MEDICAL HISTORY: 2 G ANCEFadministered within 1 hour of the procedure.0.5 mg Versed ANESTHESIA/SEDATION: 31 minutes, the patient's level of consciousness and physiological status was monitored by radiology nursing. CONTRAST:  None. COMPLICATIONS: None immediate PROCEDURE: Informed consent was obtained from the patient following explanation of the procedure, risks, benefits and alternatives. The patient understands, agrees and consents for the procedure. All questions were addressed. A time out was performed. Maximal barrier sterile technique utilized including caps, mask, sterile gowns, sterile gloves, large sterile drape, hand hygiene, and 2% chlorhexidine scrub. Under sterile conditions and local anesthesia, right internal jugular micropuncture venous access was performed. Access was performed with ultrasound. Images were obtained for documentation. A guide wire was inserted followed by a transitional dilator. This allowed insertion of a guide wire and catheter into the IVC. Measurements were obtained from the SVC / RA junction back  to the right IJ venotomy site. In the right infraclavicular chest, a subcutaneous pocket was created over the second anterior rib. This was done under sterile conditions and local anesthesia. 1% lidocaine with epinephrine was  utilized for this. A 2.5 cm incision was made in the skin. Blunt dissection was performed to create a subcutaneous pocket over the right pectoralis major muscle. The pocket was flushed with saline vigorously. There was adequate hemostasis. The port catheter was assembled and checked for leakage. The port catheter was secured in the pocket with two retention sutures. The tubing was tunneled subcutaneously to the right venotomy site and inserted into the SVC/RA junction through a valved peel-away sheath. Position was confirmed with fluoroscopy. Images were obtained for documentation. The patient tolerated the procedure well. No immediate complications. Incisions were closed in a two layer fashion with 4 - 0 Vicryl suture. Dermabond was applied to the skin. The port catheter was accessed, blood was aspirated followed by saline and heparin flushes. Needle was removed. A dry sterile dressing was applied. IMPRESSION: Ultrasound and fluoroscopically guided right internal jugular single lumen power port catheter insertion. Tip in the SVC/RA junction. Catheter ready for use. Electronically Signed   By: Jerilynn Mages.  Shick M.D.   On: 02/15/2015 14:47   Ir US Guide Vasc Access Right  02/15/2015  CLINICAL DATA:  Metastatic lung carcinoma EXAM: RIGHT INTERNAL JUGULAR SINGLE LUMEN POWER PORT CATHETER INSERTION Date:  1/23/20171/23/2017 2:01 pm Radiologist:  M. Daryll Brod, MD Guidance:  ULTRASOUND AND FLUOROSCOPIC FLUOROSCOPY TIME:  24 SECONDS, 2.5 MGY MEDICATIONS AND MEDICAL HISTORY: 2 G ANCEFadministered within 1 hour of the procedure.0.5 mg Versed ANESTHESIA/SEDATION: 31 minutes, the patient's level of consciousness and physiological status was monitored by radiology nursing. CONTRAST:  None. COMPLICATIONS: None immediate PROCEDURE: Informed consent was obtained from the patient following explanation of the procedure, risks, benefits and alternatives. The patient understands, agrees and consents for the procedure. All questions were  addressed. A time out was performed. Maximal barrier sterile technique utilized including caps, mask, sterile gowns, sterile gloves, large sterile drape, hand hygiene, and 2% chlorhexidine scrub. Under sterile conditions and local anesthesia, right internal jugular micropuncture venous access was performed. Access was performed with ultrasound. Images were obtained for documentation. A guide wire was inserted followed by a transitional dilator. This allowed insertion of a guide wire and catheter into the IVC. Measurements were obtained from the SVC / RA junction back to the right IJ venotomy site. In the right infraclavicular chest, a subcutaneous pocket was created over the second anterior rib. This was done under sterile conditions and local anesthesia. 1% lidocaine with epinephrine was utilized for this. A 2.5 cm incision was made in the skin. Blunt dissection was performed to create a subcutaneous pocket over the right pectoralis major muscle. The pocket was flushed with saline vigorously. There was adequate hemostasis. The port catheter was assembled and checked for leakage. The port catheter was secured in the pocket with two retention sutures. The tubing was tunneled subcutaneously to the right venotomy site and inserted into the SVC/RA junction through a valved peel-away sheath. Position was confirmed with fluoroscopy. Images were obtained for documentation. The patient tolerated the procedure well. No immediate complications. Incisions were closed in a two layer fashion with 4 - 0 Vicryl suture. Dermabond was applied to the skin. The port catheter was accessed, blood was aspirated followed by saline and heparin flushes. Needle was removed. A dry sterile dressing was applied. IMPRESSION: Ultrasound and fluoroscopically guided right internal jugular  single lumen power port catheter insertion. Tip in the SVC/RA junction. Catheter ready for use. Electronically Signed   By: Jerilynn Mages.  Shick M.D.   On: 02/15/2015 14:47       Medical Consultants:    None.  Anti-Infectives:   Anti-infectives    Start     Dose/Rate Route Frequency Ordered Stop   02/15/15 1249  ceFAZolin (ANCEF) 2-3 GM-% IVPB SOLR    Comments:  Hunt, Rebecca   : cabinet override      02/15/15 1249 02/15/15 1414   02/15/15 1145  ceFAZolin (ANCEF) IVPB 2 g/50 mL premix    Comments:  Send with pt to Radiology for procedure   2 g 100 mL/hr over 30 Minutes Intravenous On call 02/15/15 0953 02/15/15 1342      Subjective:    Moishy A Marchant he relates he feels lightheaded, denies any chest pain but shortness of breath with ambulation.  Objective:    Filed Vitals:   02/15/15 1412 02/15/15 1514 02/15/15 2102 02/16/15 0543  BP: 109/72 119/54 137/67 127/56  Pulse: 77 81 98 83  Temp:  97.5 F (36.4 C) 98 F (36.7 C) 97.2 F (36.2 C)  TempSrc:  Oral Oral Oral  Resp: '17 16 18 18  '$ Height:      Weight:    72 kg (158 lb 11.7 oz)  SpO2: 100% 98% 99% 94%    Intake/Output Summary (Last 24 hours) at 02/16/15 1148 Last data filed at 02/16/15 0921  Gross per 24 hour  Intake    360 ml  Output    450 ml  Net    -90 ml   Filed Weights   02/12/15 1424 02/13/15 0513 02/16/15 0543  Weight: 69.3 kg (152 lb 12.5 oz) 72.8 kg (160 lb 7.9 oz) 72 kg (158 lb 11.7 oz)    Exam: Gen:  NAD Cardiovascular:  RRR. Chest and lungs:   Good air movement clear to auscultation Abdomen:  Abdomen soft, NT/ND, + BS Extremities:  No edema.   Data Reviewed:    Labs: Basic Metabolic Panel:  Recent Labs Lab 02/12/15 1151 02/13/15 0426 02/16/15 0400  NA 135 136 137  K 3.8 4.2 4.7  CL 96* 101 106  CO2 '25 26 24  '$ GLUCOSE 214* 166* 125*  BUN 28* 26* 17  CREATININE 0.77 0.73 0.75  CALCIUM 9.7 8.2* 8.3*   GFR Estimated Creatinine Clearance: 82.5 mL/min (by C-G formula based on Cr of 0.75). Liver Function Tests:  Recent Labs Lab 02/13/15 0426  AST 69*  ALT 76*  ALKPHOS 156*  BILITOT 1.0  PROT 4.9*  ALBUMIN 1.8*   No results for  input(s): LIPASE, AMYLASE in the last 168 hours. No results for input(s): AMMONIA in the last 168 hours. Coagulation profile  Recent Labs Lab 02/15/15 0905  INR 1.13    CBC:  Recent Labs Lab 02/12/15 1151 02/13/15 0426 02/14/15 1050 02/16/15 0400  WBC 8.8 6.6 7.6 8.3  HGB 9.7* 7.7* 9.0* 9.4*  HCT 29.4* 23.7* 27.8* 29.1*  MCV 89.4 90.1 85.5 88.2  PLT 144* 108* 106* 113*   Cardiac Enzymes: No results for input(s): CKTOTAL, CKMB, CKMBINDEX, TROPONINI in the last 168 hours. BNP (last 3 results) No results for input(s): PROBNP in the last 8760 hours. CBG:  Recent Labs Lab 02/12/15 1145 02/13/15 0741 02/14/15 0758 02/15/15 0749 02/16/15 0815  GLUCAP 177* 159* 135* 144* 148*   D-Dimer: No results for input(s): DDIMER in the last 72 hours. Hgb A1c: No results for  input(s): HGBA1C in the last 72 hours. Lipid Profile: No results for input(s): CHOL, HDL, LDLCALC, TRIG, CHOLHDL, LDLDIRECT in the last 72 hours. Thyroid function studies: No results for input(s): TSH, T4TOTAL, T3FREE, THYROIDAB in the last 72 hours.  Invalid input(s): FREET3 Anemia work up: No results for input(s): VITAMINB12, FOLATE, FERRITIN, TIBC, IRON, RETICCTPCT in the last 72 hours. Sepsis Labs:  Recent Labs Lab 02/12/15 1151 02/13/15 0426 02/14/15 1050 02/16/15 0400  WBC 8.8 6.6 7.6 8.3   Microbiology No results found for this or any previous visit (from the past 240 hour(s)).   Medications:   . sodium chloride   Intravenous Once  . sodium chloride   Intravenous Once  . dexamethasone  4 mg Oral Daily  . metoprolol  50 mg Oral BID  . pantoprazole  40 mg Oral BID AC  . protein supplement shake  11 oz Oral BID BM  . rosuvastatin  10 mg Oral Daily  . sucralfate  1 g Oral TID WC & HS   Continuous Infusions: . sodium chloride 50 mL/hr at 02/16/15 0750    Time spent: 25 min   LOS: 4 days   Charlynne Cousins  [facility] Pager [phone]  *Please refer to [email or web address],  password [email or web address] to get updated schedule on who will round on this patient, as hospitalists switch teams weekly. If 7PM-7AM, please contact night-coverage at [email or web address], password [email or web address] for any overnight needs.  02/16/2015, 11:48 AM

## 2015-02-17 ENCOUNTER — Encounter: Payer: Self-pay | Admitting: Radiation Oncology

## 2015-02-17 ENCOUNTER — Inpatient Hospital Stay (HOSPITAL_COMMUNITY): Payer: Medicare Other

## 2015-02-17 ENCOUNTER — Encounter: Payer: Self-pay | Admitting: Skilled Nursing Facility1

## 2015-02-17 ENCOUNTER — Ambulatory Visit
Admission: RE | Admit: 2015-02-17 | Discharge: 2015-02-17 | Disposition: A | Discharge status: Home / Self Care | Payer: Medicare Other | Source: Ambulatory Visit | Attending: Radiation Oncology | Admitting: Radiation Oncology

## 2015-02-17 DIAGNOSIS — E876 Hypokalemia: Secondary | ICD-10-CM

## 2015-02-17 DIAGNOSIS — C7800 Secondary malignant neoplasm of unspecified lung: Secondary | ICD-10-CM

## 2015-02-17 LAB — COMPREHENSIVE METABOLIC PANEL
ALK PHOS: 275 U/L — AB (ref 38–126)
ALT: 95 U/L — AB (ref 17–63)
AST: 102 U/L — AB (ref 15–41)
Albumin: 1.6 g/dL — ABNORMAL LOW (ref 3.5–5.0)
Anion gap: 8 (ref 5–15)
BILIRUBIN TOTAL: 0.7 mg/dL (ref 0.3–1.2)
BUN: 16 mg/dL (ref 6–20)
CALCIUM: 8.1 mg/dL — AB (ref 8.9–10.3)
CHLORIDE: 104 mmol/L (ref 101–111)
CO2: 23 mmol/L (ref 22–32)
CREATININE: 0.52 mg/dL — AB (ref 0.61–1.24)
GFR calc Af Amer: >60 mL/min (ref >60)
Glucose, Bld: 119 mg/dL — ABNORMAL HIGH (ref 65–99)
Potassium: 3.3 mmol/L — ABNORMAL LOW (ref 3.5–5.1)
Sodium: 135 mmol/L (ref 135–145)
TOTAL PROTEIN: 4.8 g/dL — AB (ref 6.5–8.1)

## 2015-02-17 LAB — CBC WITH DIFFERENTIAL/PLATELET
BASOS ABS: 0 10*3/uL (ref 0.0–0.1)
Basophils Relative: 0 %
Eosinophils Absolute: 0 10*3/uL (ref 0.0–0.7)
Eosinophils Relative: 0 %
HEMATOCRIT: 26.7 % — AB (ref 39.0–52.0)
HEMOGLOBIN: 8.8 g/dL — AB (ref 13.0–17.0)
LYMPHS ABS: 0.5 10*3/uL — AB (ref 0.7–4.0)
LYMPHS PCT: 6 %
MCH: 28.8 pg (ref 26.0–34.0)
MCHC: 33 g/dL (ref 30.0–36.0)
MCV: 87.3 fL (ref 78.0–100.0)
Monocytes Absolute: 0.3 10*3/uL (ref 0.1–1.0)
Monocytes Relative: 4 %
NEUTROS ABS: 6.4 10*3/uL (ref 1.7–7.7)
Neutrophils Relative %: 90 %
Platelets: 92 10*3/uL — ABNORMAL LOW (ref 150–400)
RBC: 3.06 MIL/uL — AB (ref 4.22–5.81)
RDW: 15.4 % (ref 11.5–15.5)
WBC: 7.2 10*3/uL (ref 4.0–10.5)

## 2015-02-17 LAB — GLUCOSE, CAPILLARY: GLUCOSE-CAPILLARY: 169 mg/dL — AB (ref 65–99)

## 2015-02-17 MED ORDER — POTASSIUM CHLORIDE 10 MEQ/50ML IV SOLN
10.0000 meq | Freq: Once | INTRAVENOUS | Status: AC
Start: 2015-02-17 — End: 2015-02-17
  Administered 2015-02-17: 10 meq via INTRAVENOUS
  Filled 2015-02-17: qty 50

## 2015-02-17 MED ORDER — TRAMADOL HCL 50 MG PO TABS
50.0000 mg | ORAL_TABLET | Freq: Four times a day (QID) | ORAL | Status: DC | PRN
  Administered 2015-02-17 – 2015-02-18 (×3): 50 mg via ORAL
  Filled 2015-02-17 (×3): qty 1

## 2015-02-17 MED ORDER — POTASSIUM CHLORIDE 10 MEQ/50ML IV SOLN
10.0000 meq | INTRAVENOUS | Status: AC
  Administered 2015-02-17 (×3): 10 meq via INTRAVENOUS
  Filled 2015-02-17 (×3): qty 50

## 2015-02-17 MED ORDER — SODIUM CHLORIDE 0.9 % IV SOLN
510.0000 mg | Freq: Once | INTRAVENOUS | Status: AC
  Administered 2015-02-17: 510 mg via INTRAVENOUS
  Filled 2015-02-17: qty 17

## 2015-02-17 NOTE — Progress Notes (Signed)
Subjective:     Patient ID: Charles Luna, male   DOB: [birth year], 75 y.o.   MRN: [id number]  HPI   Review of Systems     Objective:   Physical Exam To assist the pt in identifying dietary strategies to gain some lost wt back.    Assessment:     Pt identified as being malnourished due to lost wt. Pt was contacted via the telephone at [phone]. Pt was unavailable.    Plan:     Dietitian left a message prompting the pt to call Ernestene Kiel CSO,RD, LDN at [phone].

## 2015-02-17 NOTE — Progress Notes (Signed)
Charles Luna really is not all that much better. He is tired. He sleeps a lot. Some of this may be medication area and some of this may be being deconditioned. I have a bathroom that a lot of this is part from his underlying malignancy.  He is doing radiation therapy.  His hemoglobin is dropping. I wonder if he needs some iron. This may help with his overall fatigue. I am going to check a testosterone level on him.  His potassium 3.3 so I will need to replace this.  I appreciate physical therapy, and around to see him. He refused treatment yesterday. I think that we have to "encourage" him to do physical therapy.  His albumin is only 1.6. This I think is an ominous factor. I will probably have to check a prealbumin.  He really needs to get better if we're going to try systemic chemotherapy. It is clearly possible that we may not be able to do systemic chemotherapy on him.  His vital signs are all relatively stable.  There is really nothing new on his physical exam from yesterday.  Charles Luna has metastatic lung cancer. He has extensive metastatic disease. He came in with a GI bleed from a gastric ulcer.  His hemoglobin is dropping. He may need to be transfused again. We will see what his value is tomorrow.  I will try him on some iron. I will give him some potassium replacement.  I appreciate all the great care that he is getting from the staff on [place] 55:22

## 2015-02-17 NOTE — Progress Notes (Signed)
[facility] PROGRESS NOTE    Progress Note   Charles Luna DGL:[id number] DOB: September 05, 1940 DOA: 02/12/2015 PCP: Sherren Mocha, MD   Brief Narrative:   Charles Luna is an 75 y.o. male medical history significant for asbestosis Bowsher, 50-pack-year history of tobacco use, stopped about 25 years ago. He was seen by Dr. Marin Olp of oncology earlier in January of this year and this is for follow-up on his finding of metastatic brain cancer.   Assessment/Plan:  Generalized weakness/dehydration, acute blood loss anemia/normocytic anemia/normocytic anemia In the setting of metastatic lung cancer with ongoing radiation and drop hemoglobin from 13 on 01/27/2015 to 7.7 on 02/13/2015. Transfuse 1 unit of packed red blood cells on 02/13/2015 his hemoglobin continues to trend up. He status post EGD showed a peptic ulcer disease. He'll continue Protonix twice a day for 4 weeks then continue Protonix indefinitely. Appreciate GIs assistance. Physical therapy evaluated the patient recommended H/H. Change Protonix to twice a day.  Productive cough, will get cxr, sputum culture, swallow eval  Primary lung cancer with metastatic brain lesions: Cont radiation therapy, port in place. Follow-up with oncology as an outpatient.  Thrombocytopenia: In the setting of malignancy. Improving slowly.  Essential hypertension: Seems to be fairly control continue metoprolol.     DVT Prophylaxis - Place SCDs  Family Communication: patient and wife in room Disposition Plan: SNF Code Status:     Code Status Orders        Start     Ordered   02/12/15 1425  Do not attempt resuscitation (DNR)   Continuous    Question Answer Comment  In the event of cardiac or respiratory ARREST Do not call a "code blue"   In the event of cardiac or respiratory ARREST Do not perform Intubation, CPR, defibrillation or ACLS   In the event of cardiac or respiratory ARREST Use medication by any route, position, wound  care, and other measures to relive pain and suffering. May use oxygen, suction and manual treatment of airway obstruction as needed for comfort.      02/12/15 1424    Code Status History    Date Active Date Inactive Code Status Order ID Comments User Context   02/12/2015 12:58 PM 02/12/2015  2:24 PM DNR [id number]  Orlie Dakin, MD ED        IV Access:    Peripheral IV   Procedures and diagnostic studies:   Ir Fluoro Guide Cv Line Right  02/15/2015  CLINICAL DATA:  Metastatic lung carcinoma EXAM: RIGHT INTERNAL JUGULAR SINGLE LUMEN POWER PORT CATHETER INSERTION Date:  1/23/20171/23/2017 2:01 pm Radiologist:  M. Daryll Brod, MD Guidance:  ULTRASOUND AND FLUOROSCOPIC FLUOROSCOPY TIME:  24 SECONDS, 2.5 MGY MEDICATIONS AND MEDICAL HISTORY: 2 G ANCEFadministered within 1 hour of the procedure.0.5 mg Versed ANESTHESIA/SEDATION: 31 minutes, the patient's level of consciousness and physiological status was monitored by radiology nursing. CONTRAST:  None. COMPLICATIONS: None immediate PROCEDURE: Informed consent was obtained from the patient following explanation of the procedure, risks, benefits and alternatives. The patient understands, agrees and consents for the procedure. All questions were addressed. A time out was performed. Maximal barrier sterile technique utilized including caps, mask, sterile gowns, sterile gloves, large sterile drape, hand hygiene, and 2% chlorhexidine scrub. Under sterile conditions and local anesthesia, right internal jugular micropuncture venous access was performed. Access was performed with ultrasound. Images were obtained for documentation. A guide wire was inserted followed by a transitional dilator. This allowed insertion of a guide wire and catheter into  the IVC. Measurements were obtained from the SVC / RA junction back to the right IJ venotomy site. In the right infraclavicular chest, a subcutaneous pocket was created over the second anterior rib. This was done  under sterile conditions and local anesthesia. 1% lidocaine with epinephrine was utilized for this. A 2.5 cm incision was made in the skin. Blunt dissection was performed to create a subcutaneous pocket over the right pectoralis major muscle. The pocket was flushed with saline vigorously. There was adequate hemostasis. The port catheter was assembled and checked for leakage. The port catheter was secured in the pocket with two retention sutures. The tubing was tunneled subcutaneously to the right venotomy site and inserted into the SVC/RA junction through a valved peel-away sheath. Position was confirmed with fluoroscopy. Images were obtained for documentation. The patient tolerated the procedure well. No immediate complications. Incisions were closed in a two layer fashion with 4 - 0 Vicryl suture. Dermabond was applied to the skin. The port catheter was accessed, blood was aspirated followed by saline and heparin flushes. Needle was removed. A dry sterile dressing was applied. IMPRESSION: Ultrasound and fluoroscopically guided right internal jugular single lumen power port catheter insertion. Tip in the SVC/RA junction. Catheter ready for use. Electronically Signed   By: Jerilynn Mages.  Shick M.D.   On: 02/15/2015 14:47   Ir US Guide Vasc Access Right  02/15/2015  CLINICAL DATA:  Metastatic lung carcinoma EXAM: RIGHT INTERNAL JUGULAR SINGLE LUMEN POWER PORT CATHETER INSERTION Date:  1/23/20171/23/2017 2:01 pm Radiologist:  M. Daryll Brod, MD Guidance:  ULTRASOUND AND FLUOROSCOPIC FLUOROSCOPY TIME:  24 SECONDS, 2.5 MGY MEDICATIONS AND MEDICAL HISTORY: 2 G ANCEFadministered within 1 hour of the procedure.0.5 mg Versed ANESTHESIA/SEDATION: 31 minutes, the patient's level of consciousness and physiological status was monitored by radiology nursing. CONTRAST:  None. COMPLICATIONS: None immediate PROCEDURE: Informed consent was obtained from the patient following explanation of the procedure, risks, benefits and alternatives.  The patient understands, agrees and consents for the procedure. All questions were addressed. A time out was performed. Maximal barrier sterile technique utilized including caps, mask, sterile gowns, sterile gloves, large sterile drape, hand hygiene, and 2% chlorhexidine scrub. Under sterile conditions and local anesthesia, right internal jugular micropuncture venous access was performed. Access was performed with ultrasound. Images were obtained for documentation. A guide wire was inserted followed by a transitional dilator. This allowed insertion of a guide wire and catheter into the IVC. Measurements were obtained from the SVC / RA junction back to the right IJ venotomy site. In the right infraclavicular chest, a subcutaneous pocket was created over the second anterior rib. This was done under sterile conditions and local anesthesia. 1% lidocaine with epinephrine was utilized for this. A 2.5 cm incision was made in the skin. Blunt dissection was performed to create a subcutaneous pocket over the right pectoralis major muscle. The pocket was flushed with saline vigorously. There was adequate hemostasis. The port catheter was assembled and checked for leakage. The port catheter was secured in the pocket with two retention sutures. The tubing was tunneled subcutaneously to the right venotomy site and inserted into the SVC/RA junction through a valved peel-away sheath. Position was confirmed with fluoroscopy. Images were obtained for documentation. The patient tolerated the procedure well. No immediate complications. Incisions were closed in a two layer fashion with 4 - 0 Vicryl suture. Dermabond was applied to the skin. The port catheter was accessed, blood was aspirated followed by saline and heparin flushes. Needle was removed. A dry  sterile dressing was applied. IMPRESSION: Ultrasound and fluoroscopically guided right internal jugular single lumen power port catheter insertion. Tip in the SVC/RA junction.  Catheter ready for use. Electronically Signed   By: Jerilynn Mages.  Shick M.D.   On: 02/15/2015 14:47     Medical Consultants:    None.  Anti-Infectives:   Anti-infectives    Start     Dose/Rate Route Frequency Ordered Stop   02/15/15 1249  ceFAZolin (ANCEF) 2-3 GM-% IVPB SOLR    Comments:  Hunt, Rebecca   : cabinet override      02/15/15 1249 02/15/15 1414   02/15/15 1145  ceFAZolin (ANCEF) IVPB 2 g/50 mL premix    Comments:  Send with pt to Radiology for procedure   2 g 100 mL/hr over 30 Minutes Intravenous On call 02/15/15 0953 02/15/15 1342      Subjective:    [place] he returned from XRT, reported feeling weak, wife in room  Objective:    Filed Vitals:   02/16/15 0543 02/16/15 1732 02/16/15 2140 02/17/15 0416  BP: 127/56 118/62 122/64 127/62  Pulse: 83 91 91 88  Temp: 97.2 F (36.2 C) 97.6 F (36.4 C) 97.8 F (36.6 C) 97.9 F (36.6 C)  TempSrc: Oral Oral Oral Oral  Resp: '18 18 16 16  '$ Height:      Weight: 72 kg (158 lb 11.7 oz)     SpO2: 94% 96% 97% 94%    Intake/Output Summary (Last 24 hours) at 02/17/15 0825 Last data filed at 02/17/15 0416  Gross per 24 hour  Intake    682 ml  Output    300 ml  Net    382 ml   Filed Weights   02/12/15 1424 02/13/15 0513 02/16/15 0543  Weight: 69.3 kg (152 lb 12.5 oz) 72.8 kg (160 lb 7.9 oz) 72 kg (158 lb 11.7 oz)    Exam: Gen:  NAD Cardiovascular:  RRR. Chest and lungs:   Crackles right lung field, left lung fields clears,  Abdomen:  Abdomen soft, NT/ND, + BS Extremities:  No edema.   Data Reviewed:    Labs: Basic Metabolic Panel:  Recent Labs Lab 02/12/15 1151 02/13/15 0426 02/16/15 0400 02/17/15 0344  NA 135 136 137 135  K 3.8 4.2 4.7 3.3*  CL 96* 101 106 104  CO2 '25 26 24 23  '$ GLUCOSE 214* 166* 125* 119*  BUN 28* 26* 17 16  CREATININE 0.77 0.73 0.75 0.52*  CALCIUM 9.7 8.2* 8.3* 8.1*   GFR Estimated Creatinine Clearance: 82.5 mL/min (by C-G formula based on Cr of 0.52). Liver Function  Tests:  Recent Labs Lab 02/13/15 0426 02/17/15 0344  AST 69* 102*  ALT 76* 95*  ALKPHOS 156* 275*  BILITOT 1.0 0.7  PROT 4.9* 4.8*  ALBUMIN 1.8* 1.6*   No results for input(s): LIPASE, AMYLASE in the last 168 hours. No results for input(s): AMMONIA in the last 168 hours. Coagulation profile  Recent Labs Lab 02/15/15 0905  INR 1.13    CBC:  Recent Labs Lab 02/12/15 1151 02/13/15 0426 02/14/15 1050 02/16/15 0400 02/17/15 0344  WBC 8.8 6.6 7.6 8.3 7.2  NEUTROABS  --   --   --   --  6.4  HGB 9.7* 7.7* 9.0* 9.4* 8.8*  HCT 29.4* 23.7* 27.8* 29.1* 26.7*  MCV 89.4 90.1 85.5 88.2 87.3  PLT 144* 108* 106* 113* 92*   Cardiac Enzymes: No results for input(s): CKTOTAL, CKMB, CKMBINDEX, TROPONINI in the last 168 hours. BNP (last  3 results) No results for input(s): PROBNP in the last 8760 hours. CBG:  Recent Labs Lab 02/12/15 1145 02/13/15 0741 02/14/15 0758 02/15/15 0749 02/16/15 0815  GLUCAP 177* 159* 135* 144* 148*   D-Dimer: No results for input(s): DDIMER in the last 72 hours. Hgb A1c: No results for input(s): HGBA1C in the last 72 hours. Lipid Profile: No results for input(s): CHOL, HDL, LDLCALC, TRIG, CHOLHDL, LDLDIRECT in the last 72 hours. Thyroid function studies: No results for input(s): TSH, T4TOTAL, T3FREE, THYROIDAB in the last 72 hours.  Invalid input(s): FREET3 Anemia work up: No results for input(s): VITAMINB12, FOLATE, FERRITIN, TIBC, IRON, RETICCTPCT in the last 72 hours. Sepsis Labs:  Recent Labs Lab 02/13/15 0426 02/14/15 1050 02/16/15 0400 02/17/15 0344  WBC 6.6 7.6 8.3 7.2   Microbiology No results found for this or any previous visit (from the past 240 hour(s)).   Medications:   . sodium chloride   Intravenous Once  . sodium chloride   Intravenous Once  . dexamethasone  4 mg Oral Daily  . ferumoxytol  510 mg Intravenous Once  . metoprolol  50 mg Oral BID  . pantoprazole  40 mg Oral BID AC  . potassium chloride  10 mEq  Intravenous Q1 Hr x 4  . protein supplement shake  11 oz Oral BID BM  . sucralfate  1 g Oral TID WC & HS   Continuous Infusions: . sodium chloride 50 mL/hr at 02/16/15 0750    Time spent: 25 min   LOS: 5 days   Pearson Picou MD PhD  [facility] Pager [phone]  *Please refer to [email or web address], password [email or web address] to get updated schedule on who will round on this patient, as hospitalists switch teams weekly. If 7PM-7AM, please contact night-coverage at [email or web address], password [email or web address] for any overnight needs.  02/17/2015, 8:25 AM

## 2015-02-17 NOTE — Progress Notes (Signed)
This CM met with pt and wife this am to offer choice for HHPT as PT recommended HHPT on 1/24. Home Health provider list left with wife and they wanted to think about plans. CM will continue to follow. Marney Doctor RN,BSN,NCM [phone]

## 2015-02-17 NOTE — Progress Notes (Signed)
Physical Therapy Treatment Patient Details Name: Charles Luna MRN: [id number] DOB: Nov 26, 1940 Today's Date: 02/17/2015    History of Present Illness 75 y.o. male with h/o lung cancer with mets to brain who is undergoing daily radiation treatments admitted with GIB, anemia, dehydration, malnutrition.    PT Comments    Pt required increased assistance this session. Min assist for all tasks. Pt barely able to make it back to room after walking short distance of ~40 feet. Pt is weak and fatigues very easily. Discussed d/c plan-wife is unsure if she can safely manage with pt at home. Feel we need to consider ST rehab placement at SNF. If pt returns home, pt/wife will need additional in home assistance.   Follow Up Recommendations  SNF (need to consider. Do not feel wife can safely manage with pt at current level.)     Equipment Recommendations  Wheelchair ;Wheelchair cushion     Recommendations for Other Services OT consult     Precautions / Restrictions Precautions Precautions: Fall Restrictions Weight Bearing Restrictions: No    Mobility  Bed Mobility Overal bed mobility: Needs Assistance Bed Mobility: Supine to Sit     Supine to sit: [facility] elevated;Min assist     General bed mobility comments: small amount of assist needed to get to EOB. Increased time. Neeed a rest break before attempting to stand  Transfers Overall transfer level: Needs assistance Equipment used: Rolling walker (2 wheeled) Transfers: Sit to/from Stand Sit to Stand: Min assist;From elevated surface         General transfer comment: Assist to rise, stabilize, control descent. VCs safety,hand placement.   Ambulation/Gait Ambulation/Gait assistance: Min assist Ambulation Distance (Feet): 40 Feet Assistive device: Rolling walker (2 wheeled) Gait Pattern/deviations: Step-through pattern;Trunk flexed;Decreased stride length     General Gait Details: Assist to stabilize pt and maneuver safely with  walker. dyspnea 3/4, O2 sats 98%. Pt barely able to make it back to the room. Fatigues very easily.    Stairs            Wheelchair Mobility    Modified Rankin (Stroke Patients Only)       Balance Overall balance assessment: Needs assistance         Standing balance support: Bilateral upper extremity supported;During functional activity Standing balance-Leahy Scale: Poor                      Cognition Arousal/Alertness: Awake/alert Behavior During Therapy: WFL for tasks assessed/performed Overall Cognitive Status: Within Functional Limits for tasks assessed                      Exercises      General Comments        Pertinent Vitals/Pain Pain Assessment: No/denies pain    Home Living                      Prior Function            PT Goals (current goals can now be found in the care plan section) Progress towards PT goals: Not progressing toward goals - comment (requiring increased assistance on today)    Frequency  Min 3X/week    PT Plan Current plan remains appropriate    Co-evaluation             End of Session Equipment Utilized During Treatment: Gait belt Activity Tolerance: Patient limited by fatigue Patient left: in chair;with call bell/phone within reach;with  family/visitor present     Time: 0370-4888 PT Time Calculation (min) (ACUTE ONLY): 20 min  Charges:  $Gait Training: 8-22 mins                    G Codes:      Weston Anna, MPT Pager: [phone]

## 2015-02-17 NOTE — Progress Notes (Signed)
CSW received notification from PT that PT re-evaluated pt and have changed recommendation to SNF for short term rehab.  CSW met with pt, pt wife, and pt son-in-law at bedside to discuss. Pt agreeable to short term rehab and [facility] search. CSW clarified pt questions about receiving radiation while at SNF.   CSW completed FL2 and initiated SNF search.  CSW to follow up re: SNF bed offers.  Full psychosocial assessment to follow.  Alison Murray, MSW, [place] Work [phone]

## 2015-02-17 NOTE — NC FL2 (Signed)
[place] LEVEL OF CARE SCREENING TOOL     IDENTIFICATION  Patient Name: Charles Luna Birthdate: 1940/10/21 Sex: male Admission Date (Current Location): 02/12/2015  [facility] and [place] Number:  Herbalist and Address:  [facility],  [place] [address] [place]      Provider Number: [id number]  Attending Physician Name and Address:  Florencia Reasons, MD  Relative Name and Phone Number:       Current Level of Care: Hospital Recommended Level of Care: [place] Prior Approval Number:    Date Approved/Denied:   PASRR Number:    Discharge Plan: SNF    Current Diagnoses: Patient Active Problem List   Diagnosis Date Noted  . Metastatic lung carcinoma ([place])   . Brain metastasis ([place]) 02/16/2015  . Acute upper GI bleed 02/13/2015  . Dehydration 02/12/2015  . Acute blood loss anemia 02/12/2015  . Thrombocytopenia ([place]) 02/12/2015  . Dyslipidemia 02/12/2015  . Benign essential HTN 02/12/2015  . Weakness 02/12/2015  . Severe protein-calorie malnutrition ([place]) 02/12/2015  . Lung cancer, primary, with metastasis from lung to other site [facility]) 01/27/2015    Orientation RESPIRATION BLADDER Height & Weight    Self, Time, Situation, Place  Normal Continent '5\' 10"'$  (177.8 cm) 158 lbs.  BEHAVIORAL SYMPTOMS/MOOD NEUROLOGICAL BOWEL NUTRITION STATUS   (n/a)  (n/a) Continent Diet (DIET SOFT)  AMBULATORY STATUS COMMUNICATION OF NEEDS Skin   Limited Assist Verbally Normal                       Personal Care Assistance Level of Assistance  Bathing, Feeding, Dressing Bathing Assistance: Limited assistance Feeding assistance: Independent Dressing Assistance: Limited assistance     Functional Limitations Info  Sight, Hearing, Speech Sight Info: Adequate Hearing Info: Adequate Speech Info: Adequate    SPECIAL CARE FACTORS FREQUENCY  PT (By licensed PT), OT (By licensed OT)     PT Frequency: 5 x a week OT Frequency: 5 x a  week            Contractures Contractures Info: Not present    Additional Factors Info  Code Status, Allergies Code Status Info: DNR code status Allergies Info: Tylenol           Current Medications (02/17/2015):  This is the current hospital active medication list Current Facility-Administered Medications  Medication Dose Route Frequency Provider Last Rate Last Dose  . 0.9 %  sodium chloride infusion   Intravenous Continuous Volanda Napoleon, MD 50 mL/hr at 02/16/15 0750    . 0.9 %  sodium chloride infusion   Intravenous Once Charlynne Cousins, MD      . 0.9 %  sodium chloride infusion   Intravenous Once Wilford Corner, MD      . dexamethasone (DECADRON) tablet 4 mg  4 mg Oral Daily Volanda Napoleon, MD   4 mg at 02/17/15 1018  . loratadine (CLARITIN) tablet 10 mg  10 mg Oral Daily PRN Robbie Lis, MD      . metoprolol (LOPRESSOR) tablet 50 mg  50 mg Oral BID Robbie Lis, MD   50 mg at 02/17/15 1018  . ondansetron (ZOFRAN) tablet 4 mg  4 mg Oral Q6H PRN Robbie Lis, MD       Or  . ondansetron [facility]) injection 4 mg  4 mg Intravenous Q6H PRN Robbie Lis, MD      . pantoprazole (PROTONIX) EC tablet 40 mg  40 mg Oral BID AC Volanda Napoleon, MD   40 mg at 02/17/15 0853  . potassium chloride 10 mEq in 50 mL *CENTRAL LINE* IVPB  10 mEq Intravenous Q1 Hr x 4 Volanda Napoleon, MD   10 mEq at 02/17/15 1121  . protein supplement (PREMIER PROTEIN) liquid  11 oz Oral BID BM Asencion Islam, RD   11 oz at 02/17/15 1552  . sucralfate (CARAFATE) 1 GM/10ML suspension 1 g  1 g Oral TID WC & HS Ronald Lobo, MD   1 g at 02/17/15 1327  . traMADol (ULTRAM) tablet 50 mg  50 mg Oral Q6H PRN Volanda Napoleon, MD   50 mg at 02/17/15 1120     Discharge Medications: Please see discharge summary for a list of discharge medications.  Relevant Imaging Results:  Relevant Lab Results:   Additional Information SSN: [id number]; Pt receiving outpatient Radiation treatment Mon-Fri at [place] [facility], final treatment scheduled for 02/26/2015.  KIDD, SUZANNA A, LCSW

## 2015-02-18 ENCOUNTER — Other Ambulatory Visit: Payer: Medicare Other

## 2015-02-18 ENCOUNTER — Ambulatory Visit: Payer: Medicare Other | Admitting: Hematology & Oncology

## 2015-02-18 ENCOUNTER — Ambulatory Visit: Payer: Medicare Other

## 2015-02-18 ENCOUNTER — Telehealth: Payer: Self-pay | Admitting: Oncology

## 2015-02-18 DIAGNOSIS — R627 Adult failure to thrive: Secondary | ICD-10-CM

## 2015-02-18 DIAGNOSIS — N329 Bladder disorder, unspecified: Secondary | ICD-10-CM

## 2015-02-18 DIAGNOSIS — C7931 Secondary malignant neoplasm of brain: Secondary | ICD-10-CM

## 2015-02-18 DIAGNOSIS — C3491 Malignant neoplasm of unspecified part of right bronchus or lung: Secondary | ICD-10-CM

## 2015-02-18 LAB — CBC WITH DIFFERENTIAL/PLATELET
BASOS ABS: 0 10*3/uL (ref 0.0–0.1)
Basophils Relative: 0 %
Eosinophils Absolute: 0 10*3/uL (ref 0.0–0.7)
Eosinophils Relative: 0 %
HEMATOCRIT: 25 % — AB (ref 39.0–52.0)
Hemoglobin: 8.1 g/dL — ABNORMAL LOW (ref 13.0–17.0)
LYMPHS PCT: 4 %
Lymphs Abs: 0.3 10*3/uL — ABNORMAL LOW (ref 0.7–4.0)
MCH: 28.2 pg (ref 26.0–34.0)
MCHC: 32.4 g/dL (ref 30.0–36.0)
MCV: 87.1 fL (ref 78.0–100.0)
Monocytes Absolute: 0.3 10*3/uL (ref 0.1–1.0)
Monocytes Relative: 4 %
NEUTROS ABS: 6.7 10*3/uL (ref 1.7–7.7)
Neutrophils Relative %: 92 %
Platelets: 91 10*3/uL — ABNORMAL LOW (ref 150–400)
RBC: 2.87 MIL/uL — AB (ref 4.22–5.81)
RDW: 15.4 % (ref 11.5–15.5)
WBC: 7.2 10*3/uL (ref 4.0–10.5)

## 2015-02-18 LAB — COMPREHENSIVE METABOLIC PANEL
ALT: 96 U/L — AB (ref 17–63)
AST: 90 U/L — AB (ref 15–41)
Albumin: 1.6 g/dL — ABNORMAL LOW (ref 3.5–5.0)
Alkaline Phosphatase: 277 U/L — ABNORMAL HIGH (ref 38–126)
Anion gap: 7 (ref 5–15)
BILIRUBIN TOTAL: 1 mg/dL (ref 0.3–1.2)
BUN: 14 mg/dL (ref 6–20)
CHLORIDE: 104 mmol/L (ref 101–111)
CO2: 24 mmol/L (ref 22–32)
CREATININE: 0.46 mg/dL — AB (ref 0.61–1.24)
Calcium: 8 mg/dL — ABNORMAL LOW (ref 8.9–10.3)
GFR calc Af Amer: >60 mL/min (ref >60)
Glucose, Bld: 124 mg/dL — ABNORMAL HIGH (ref 65–99)
Potassium: 3.3 mmol/L — ABNORMAL LOW (ref 3.5–5.1)
Sodium: 135 mmol/L (ref 135–145)
Total Protein: 4.7 g/dL — ABNORMAL LOW (ref 6.5–8.1)

## 2015-02-18 LAB — URINALYSIS, ROUTINE W REFLEX MICROSCOPIC
Bilirubin Urine: NEGATIVE
GLUCOSE, UA: NEGATIVE mg/dL
Hgb urine dipstick: NEGATIVE
KETONES UR: NEGATIVE mg/dL
LEUKOCYTES UA: NEGATIVE
Nitrite: NEGATIVE
PH: 6.5 (ref 5.0–8.0)
Protein, ur: NEGATIVE mg/dL
SPECIFIC GRAVITY, URINE: 1.024 (ref 1.005–1.030)

## 2015-02-18 LAB — PREPARE RBC (CROSSMATCH)

## 2015-02-18 LAB — GLUCOSE, CAPILLARY: GLUCOSE-CAPILLARY: 127 mg/dL — AB (ref 65–99)

## 2015-02-18 LAB — TSH: TSH: 0.751 u[IU]/mL (ref 0.350–4.500)

## 2015-02-18 LAB — PREALBUMIN: Prealbumin: 3.6 mg/dL — ABNORMAL LOW (ref 18–38)

## 2015-02-18 MED ORDER — OXYCODONE HCL 5 MG PO TABS
5.0000 mg | ORAL_TABLET | ORAL | Status: DC | PRN
  Administered 2015-02-18: 5 mg via ORAL
  Filled 2015-02-18: qty 1

## 2015-02-18 MED ORDER — FUROSEMIDE 10 MG/ML IJ SOLN: 20.0000 mg | Freq: Once | INTRAMUSCULAR | Status: DC

## 2015-02-18 MED ORDER — TAMSULOSIN HCL 0.4 MG PO CAPS
0.4000 mg | ORAL_CAPSULE | Freq: Every day | ORAL | Status: DC
  Filled 2015-02-18: qty 1

## 2015-02-18 MED ORDER — BELLADONNA ALKALOIDS-OPIUM 16.2-60 MG RE SUPP
1.0000 | Freq: Four times a day (QID) | RECTAL | Status: DC | PRN
  Administered 2015-02-18: 1 via RECTAL
  Filled 2015-02-18: qty 1

## 2015-02-18 MED ORDER — SODIUM CHLORIDE 0.9 % IV SOLN: Freq: Once | INTRAVENOUS | Status: DC

## 2015-02-18 MED ORDER — OXYCODONE HCL 5 MG PO TABS
5.0000 mg | ORAL_TABLET | ORAL | Status: DC | PRN
  Administered 2015-02-18 – 2015-02-19 (×4): 5 mg via ORAL
  Filled 2015-02-18 (×5): qty 1

## 2015-02-18 NOTE — Telephone Encounter (Signed)
Legrand Rams, RN from [address] called and said that [place] does not want any more radiation.  Notified Faith, RT on [place] 2.

## 2015-02-18 NOTE — Progress Notes (Signed)
CSW continuing to follow.   CSW received notification from palliative care NP, Wadie Lessen that palliative care meeting held today and pt and pt wife have decided to transition to comfort and evaluation will be made tomorrow regarding appropriateness for residential hospice. Palliative Care NP, Wadie Lessen asked for CSW assistance as pt wife shared that pt son is incarcerated and pt and pt wife are hopeful that pt son can be approved a visit to pt before pt passes. Palliative Care NP, Wadie Lessen provided CSW contact information to correctional facility.   CSW contacted [facility] regarding pt son. CSW spoke to a Mr. Tera Mater with facility who stated that he would notify the chaplain at the [facility] as the chaplain assist in making determination about supervised visit. Per Mr. Tera Mater, [place] chaplain, Ree Shay will contact CSW tomorrow morning for follow up. CSW to await phone call from [facility] chaplain.   CSW updated pt wife at bedside and provided CSW contact information.   CSW to continue to follow.   Alison Murray, MSW, [place] Work [phone]

## 2015-02-18 NOTE — Consult Note (Signed)
Consultation Note Date: 02/18/2015   Patient Name: Charles Luna  DOB: 19-May-1940  MRN: [id number]  Age / Sex: 75 y.o., male  PCP: Sherren Mocha, MD Referring Physician: Florencia Reasons, MD  Reason for Consultation: Establishing goals of care, Non pain symptom management, Pain control and Psychosocial/spiritual support   Clinical Assessment/Narrative:  75 yo male recently diagnosed with primary lung cancer with metastasis to brain (70-90) noted lesions on MRI.  Rapid decline, patient without capacity today and family faces advanced directive decisions and anticipatory care needs.   This NP Wadie Lessen reviewed medical records, received report from team, assessed the patient and then meet at the patient's bedside along with wife/Polly, daughter Warren Lacy and brother Ron  to discuss diagnosis, prognosis, GOC, EOL wishes disposition and options.   A detailed discussion was had today regarding advanced directives.  Concepts specific to code status, artifical feeding and hydration, continued IV antibiotics and rehospitalization was had.  The difference between a aggressive medical intervention path  and a palliative comfort care path for this patient at this time was had.  Values and goals of care important to patient and family were attempted to be elicited.  Concept of Hospice and Palliative Care were discussed.  Family have a clear understanding that Mr Ramseyer would want a comfort focused appraoch to his care at this time.  He has expressed his wishes to his wife in the past.  Comfort is a priority and, hope to see his son who is incarcerated before he dies.  Social work triggered to assist with this endeavor.  Natural trajectory and expectations at EOL were discussed.  Questions and concerns addressed.  Hard Choices booklet left for review. Family encouraged to call with questions or concerns.  PMT will continue to support  holistically.  Primary Decision Maker: wife    HCPOA: none docuemtned    SUMMARY OF RECOMMENDATIONS  - Attempt to get in touch with incarcerated son and get him here for a visit with his father before death (information given to SW)  - maximize comfort,  initaite Oxycodoen 5 mg po every.  Family understands and accepts sedation as side effect  - re meet in the morning to discuss disposition to hospice facility   -begin de-escalation of medical intervetnions    Code Status/Advance Care Planning: DNR    Code Status Orders        Start     Ordered   02/12/15 1425  Do not attempt resuscitation (DNR)   Continuous    Question Answer Comment  In the event of cardiac or respiratory ARREST Do not call a "code blue"   In the event of cardiac or respiratory ARREST Do not perform Intubation, CPR, defibrillation or ACLS   In the event of cardiac or respiratory ARREST Use medication by any route, position, wound care, and other measures to relive pain and suffering. May use oxygen, suction and manual treatment of airway obstruction as needed for comfort.      02/12/15 1424    Code Status History    Date Active Date Inactive Code Status Order ID Comments User Context   02/12/2015 12:58 PM 02/12/2015  2:24 PM DNR [id number]  Orlie Dakin, MD ED      Other Directives:None  Symptom Management:   Pain: Oxycodoen 5 mg every 4 hrs ppo prn  Palliative Prophylaxis:   Aspiration, Bowel Regimen, Delirium Protocol, Eye Care, Frequent Pain Assessment, Oral Care and Turn Reposition   Psycho-social/Spiritual:  Support System: Strong Desire  for further Chaplaincy support:no- family report strong community church support Additional Recommendations: Education on Hospice  Prognosis: < 2 weeks  Discharge Planning: Pending family decsion, will f/u in morning   Chief Complaint/ Primary Diagnoses: Present on Admission:  . Dehydration . Lung cancer, primary, with metastasis from lung to  other site [facility]) . Acute blood loss anemia . Thrombocytopenia ([place]) . Dyslipidemia . Benign essential HTN . Severe protein-calorie malnutrition ([place]) . Acute upper GI bleed  I have reviewed the medical record, interviewed the patient and family, and examined the patient. The following aspects are pertinent.  History reviewed. No pertinent past medical history. Social History   Social History  . Marital Status: Married    Spouse Name: N/A  . Number of Children: 2  . Years of Education: N/A   Occupational History  . Electrician    Social History Main Topics  . Smoking status: Former Smoker -- 2.00 packs/day for 25 years    Types: Cigarettes    Quit date: 12/13/1990  . Smokeless tobacco: Never Used  . Alcohol Use: No  . Drug Use: No  . Sexual Activity: Yes   Other Topics Concern  . None   Social History Narrative   Family History  Problem Relation Age of Onset  . Pancreatic cancer Mother   . Cancer Brother   . Cancer Maternal Uncle    Scheduled Meds: . sodium chloride   Intravenous Once  . sodium chloride   Intravenous Once  . sodium chloride   Intravenous Once  . dexamethasone  4 mg Oral Daily  . furosemide  20 mg Intravenous Once  . metoprolol  50 mg Oral BID  . pantoprazole  40 mg Oral BID AC  . protein supplement shake  11 oz Oral BID BM  . sucralfate  1 g Oral TID WC & HS   Continuous Infusions: . sodium chloride 50 mL/hr at 02/16/15 0750   PRN Meds:.loratadine, ondansetron **OR** ondansetron (ZOFRAN) IV, opium-belladonna, oxyCODONE, traMADol Medications Prior to Admission:  Prior to Admission medications   Medication Sig Start Date End Date Taking? Authorizing Provider  aspirin 81 MG tablet Take 81 mg by mouth daily.   Yes Historical Provider, MD  dexamethasone (DECADRON) 4 MG tablet Take 1 tablet (4 mg total) by mouth 3 (three) times daily. Patient taking differently: Take 4 mg by mouth 2 (two) times daily.  01/27/15  Yes Volanda Napoleon, MD    hydrochlorothiazide (HYDRODIURIL) 25 MG tablet Take 1 tablet (25 mg total) by mouth daily. 03/10/14  Yes Sherren Mocha, MD  ibuprofen (ADVIL,MOTRIN) 200 MG tablet Take 200 mg by mouth every 6 (six) hours as needed for moderate pain.   Yes Historical Provider, MD  Loratadine (CLARITIN) 10 MG CAPS Take 10 mg by mouth daily as needed (allergies).    Yes Historical Provider, MD  metoprolol (LOPRESSOR) 50 MG tablet Take 1 tablet (50 mg total) by mouth 2 (two) times daily. 03/10/14  Yes Sherren Mocha, MD  naproxen sodium (ANAPROX) 220 MG tablet Take 220 mg by mouth daily as needed (pain).   Yes Historical Provider, MD  nitroGLYCERIN (NITROSTAT) 0.4 MG SL tablet Place 1 tablet (0.4 mg total) under the tongue every 5 (five) minutes as needed for chest pain. 03/10/14 06/07/16 Yes Sherren Mocha, MD  potassium chloride SA (K-DUR,KLOR-CON) 20 MEQ tablet Take 1 tablet (20 mEq total) by mouth daily. 03/10/14  Yes Sherren Mocha, MD  rosuvastatin (CRESTOR) 10 MG tablet Take 1 tablet (10 mg total) by  mouth daily. 03/23/14  Yes Sherren Mocha, MD  senna (SENOKOT) 8.6 MG tablet Take 1 tablet by mouth daily.   Yes Historical Provider, MD  Wound Dressings (SONAFINE EX) Apply topically.    Historical Provider, MD   Allergies  Allergen Reactions  . Tylenol [Acetaminophen] Shortness Of Breath    Review of Systems  Constitutional: Positive for activity change, appetite change and unexpected weight change.  Gastrointestinal: Positive for abdominal pain.  Genitourinary: Positive for urgency.    Physical Exam  Constitutional: He appears lethargic. He appears ill.  Respiratory: He has decreased breath sounds in the right lower field and the left lower field.  GI: Soft.  Neurological: He appears lethargic.  Skin: Skin is warm and dry.    Vital Signs: BP 117/67 mmHg  Pulse 79  Temp(Src) 97.8 F (36.6 C) (Oral)  Resp 16  Ht '5\' 10"'$  (1.778 m)  Wt 72 kg (158 lb 11.7 oz)  BMI 22.78 kg/m2  SpO2 94%  SpO2: SpO2:  94 % O2 Device:SpO2: 94 % O2 Flow Rate: .O2 Flow Rate (L/min): 2 L/min  IO: Intake/output summary:  Intake/Output Summary (Last 24 hours) at 02/18/15 1110 Last data filed at 02/18/15 0523  Gross per 24 hour  Intake    722 ml  Output    675 ml  Net     47 ml    LBM: Last BM Date: 02/12/15 Baseline Weight: Weight: 69.4 kg (153 lb) Most recent weight: Weight: 72 kg (158 lb 11.7 oz)      Palliative Assessment/Data:  Flowsheet Rows        Most Recent Value   Intake Tab    Referral Department  Oncology   Unit at Time of Referral  Oncology Unit   Palliative Care Primary Diagnosis  Cancer   Date Notified  02/18/15   Palliative Care Type  New Palliative care   Reason for referral  Clarify Goals of Care   Date of Admission  02/12/15   # of days IP prior to Palliative referral  6   Clinical Assessment    Psychosocial & Spiritual Assessment    Palliative Care Outcomes       Additional Data Reviewed:  CBC:    Component Value Date/Time   WBC 7.2 02/18/2015 0535   WBC 18.9* 01/27/2015 1152   HGB 8.1* 02/18/2015 0535   HGB 13.1 01/27/2015 1152   HCT 25.0* 02/18/2015 0535   HCT 39.0 01/27/2015 1152   PLT 91* 02/18/2015 0535   PLT 233 01/27/2015 1152   MCV 87.1 02/18/2015 0535   MCV 88 01/27/2015 1152   NEUTROABS 6.7 02/18/2015 0535   NEUTROABS 17.2* 01/27/2015 1152   LYMPHSABS 0.3* 02/18/2015 0535   LYMPHSABS 0.4* 01/27/2015 1152   MONOABS 0.3 02/18/2015 0535   EOSABS 0.0 02/18/2015 0535   EOSABS 0.0 01/27/2015 1152   BASOSABS 0.0 02/18/2015 0535   BASOSABS 0.0 01/27/2015 1152   Comprehensive Metabolic Panel:    Component Value Date/Time   NA 135 02/18/2015 0535   NA 138 01/27/2015 1152   K 3.3* 02/18/2015 0535   K 4.9 01/27/2015 1152   CL 104 02/18/2015 0535   CO2 24 02/18/2015 0535   CO2 28 01/27/2015 1152   BUN 14 02/18/2015 0535   BUN 26.1* 01/27/2015 1152   CREATININE 0.46* 02/18/2015 0535   CREATININE 0.8 01/27/2015 1152   GLUCOSE 124* 02/18/2015 0535    GLUCOSE 119 01/27/2015 1152   CALCIUM 8.0* 02/18/2015 0535   CALCIUM 10.3 01/27/2015  1152   AST 90* 02/18/2015 0535   AST 35* 01/27/2015 1152   ALT 96* 02/18/2015 0535   ALT 74* 01/27/2015 1152   ALKPHOS 277* 02/18/2015 0535   ALKPHOS 111 01/27/2015 1152   BILITOT 1.0 02/18/2015 0535   BILITOT 0.96 01/27/2015 1152   PROT 4.7* 02/18/2015 0535   PROT 7.0 01/27/2015 1152   ALBUMIN 1.6* 02/18/2015 0535   ALBUMIN 3.0* 01/27/2015 1152   Discussed with Dr Erlinda Hong  Time In: 1100 Time Out: 1230 Time Total: 90 min Greater than 50%  of this time was spent counseling and coordinating care related to the above assessment and plan.  Signed by: Wadie Lessen, NP  Knox Royalty, NP  02/18/2015, 11:10 AM  Please contact Palliative Medicine Team phone at [phone] for questions and concerns.

## 2015-02-18 NOTE — Plan of Care (Signed)
Problem: Activity: Goal: Risk for activity intolerance will decrease Outcome: Not Progressing Needs a lot of encouragement to participate with PT, very weak/deconditioned; d/c plan SNF

## 2015-02-18 NOTE — Progress Notes (Signed)
Charles Luna and daughter met with palliative and decided not to have any more radiation therapy , radiation oncology made aware.Spoke with wife and daughter and they also decided not to go through with the blood transfusions anymore, they just want to keep patient comfortable. Dr Marin Olp made aware.

## 2015-02-18 NOTE — Progress Notes (Signed)
[facility] PROGRESS NOTE    Progress Note   Travelle Mcclimans Scheuermann JKD:[id number] DOB: January 27, 1940 DOA: 02/12/2015 PCP: Sherren Mocha, MD   Brief Narrative:   Charles Luna is an 75 y.o. male medical history significant for asbestosis Bowsher, 50-pack-year history of tobacco use, stopped about 25 years ago. He was seen by Dr. Marin Olp of oncology earlier in January of this year and this is for follow-up on his finding of metastatic brain cancer.   Assessment/Plan:   Generalized weakness/dehydration, acute blood loss anemia/normocytic anemia/normocytic anemia In the setting of metastatic lung cancer with ongoing radiation and drop hemoglobin from 13 on 01/27/2015 to 7.7 on 02/13/2015. Transfuse 1 unit of packed red blood cells on 02/13/2015, oncology ordered 2 more unit of prbc on 1/26 due to continued weakness. On gently hydration with 50cc/hr  GI bleed with acute blood loss anemia and melena on presentation: He status post EGD  On 1/22 showed a peptic ulcer disease.  Currently on ppi and carafate,  He'll continue Protonix twice a day for 4 weeks then continue Protonix indefinitely. Appreciate GIs (Dr Michail Sermon) assistance.   Primary lung cancer with metastatic brain lesions with productive cough: On decadron, in house radiation therapy, last 1/24 with report fo decreased in blurred vision, does report weakness of right grips, port in place. oncology input appreciated, due to poor ECOG score, less likely a candidate for chemo, oncology recommended palliative care consult Patient and family likely are moving toward comfort measures and hospice placement  Thrombocytopenia: In the setting of malignancy. 110-120 range  Essential hypertension: Seems to be fairly control continue metoprolol.  Hypokalemia: replace k  Bladder spasm: ua unremarkable. Will start flomax, oncology ordered foley.   FTT: palliative care consulted, will f/u on recomendations     DVT Prophylaxis - Place  SCDs  Family Communication: patient and wife in room Disposition Plan: pending palliative care input, likely shifting to comfort measures Code Status:     Code Status Orders        Start     Ordered   02/12/15 1425  Do not attempt resuscitation (DNR)   Continuous    Question Answer Comment  In the event of cardiac or respiratory ARREST Do not call a "code blue"   In the event of cardiac or respiratory ARREST Do not perform Intubation, CPR, defibrillation or ACLS   In the event of cardiac or respiratory ARREST Use medication by any route, position, wound care, and other measures to relive pain and suffering. May use oxygen, suction and manual treatment of airway obstruction as needed for comfort.      02/12/15 1424    Code Status History    Date Active Date Inactive Code Status Order ID Comments User Context   02/12/2015 12:58 PM 02/12/2015  2:24 PM DNR [id number]  Orlie Dakin, MD ED        IV Access:    Peripheral IV   Procedures and diagnostic studies:   Dg Chest Port 1 View  03/11/2015  CLINICAL DATA:  Cough EXAM: PORTABLE CHEST 1 VIEW COMPARISON:  02/12/2015 FINDINGS: Cardiomediastinal silhouette is stable. Nodular lesion in right upper lobe again noted. There is streaky right upper lobe perihilar atelectasis or early infiltrate. No pulmonary edema. Right IJ Port-A-Cath in place with tip in SVC right atrium junction. IMPRESSION: . Nodular lesion in right upper lobe again noted. There is streaky right upper lobe perihilar atelectasis or early infiltrate. No pulmonary edema. Electronically Signed   By: Lahoma Crocker  M.D.   On: 02/17/2015 13:27     Medical Consultants:    None.  Anti-Infectives:   Anti-infectives    Start     Dose/Rate Route Frequency Ordered Stop   02/15/15 1249  ceFAZolin (ANCEF) 2-3 GM-% IVPB SOLR    Comments:  Hunt, Rebecca   : cabinet override      02/15/15 1249 02/15/15 1414   02/15/15 1145  ceFAZolin (ANCEF) IVPB 2 g/50 mL premix    Comments:   Send with pt to Radiology for procedure   2 g 100 mL/hr over 30 Minutes Intravenous On call 02/15/15 0953 02/15/15 1342      Subjective:    Grover A Borunda he continued to be weak, c/o bladder spasm, wife in room  Objective:    Filed Vitals:   02/17/15 0416 02/17/15 1351 02/17/15 2051 02/18/15 0522  BP: 127/62 104/66 119/70 117/67  Pulse: 88 87 102 79  Temp: 97.9 F (36.6 C) 97.3 F (36.3 C) 97.3 F (36.3 C) 97.8 F (36.6 C)  TempSrc: Oral Oral Oral Oral  Resp: '16 16 16 16  '$ Height:      Weight:      SpO2: 94% 94% 95% 94%    Intake/Output Summary (Last 24 hours) at 02/18/15 1107 Last data filed at 02/18/15 0523  Gross per 24 hour  Intake    722 ml  Output    675 ml  Net     47 ml   Filed Weights   02/12/15 1424 02/13/15 0513 02/16/15 0543  Weight: 69.3 kg (152 lb 12.5 oz) 72.8 kg (160 lb 7.9 oz) 72 kg (158 lb 11.7 oz)    Exam: Gen:  frail Cardiovascular:  RRR. Chest and lungs:   Crackles right lung field, left lung fields clears,  Abdomen:  Abdomen soft, NT/ND, + BS Extremities:  No edema.   Data Reviewed:    Labs: Basic Metabolic Panel:  Recent Labs Lab 02/12/15 1151 02/13/15 0426 02/16/15 0400 02/17/15 0344 02/18/15 0535  NA 135 136 137 135 135  K 3.8 4.2 4.7 3.3* 3.3*  CL 96* 101 106 104 104  CO2 '25 26 24 23 24  '$ GLUCOSE 214* 166* 125* 119* 124*  BUN 28* 26* '17 16 14  '$ CREATININE 0.77 0.73 0.75 0.52* 0.46*  CALCIUM 9.7 8.2* 8.3* 8.1* 8.0*   GFR Estimated Creatinine Clearance: 82.5 mL/min (by C-G formula based on Cr of 0.46). Liver Function Tests:  Recent Labs Lab 02/13/15 0426 02/17/15 0344 02/18/15 0535  AST 69* 102* 90*  ALT 76* 95* 96*  ALKPHOS 156* 275* 277*  BILITOT 1.0 0.7 1.0  PROT 4.9* 4.8* 4.7*  ALBUMIN 1.8* 1.6* 1.6*   No results for input(s): LIPASE, AMYLASE in the last 168 hours. No results for input(s): AMMONIA in the last 168 hours. Coagulation profile  Recent Labs Lab 02/15/15 0905  INR 1.13     CBC:  Recent Labs Lab 02/13/15 0426 02/14/15 1050 02/16/15 0400 02/17/15 0344 02/18/15 0535  WBC 6.6 7.6 8.3 7.2 7.2  NEUTROABS  --   --   --  6.4 6.7  HGB 7.7* 9.0* 9.4* 8.8* 8.1*  HCT 23.7* 27.8* 29.1* 26.7* 25.0*  MCV 90.1 85.5 88.2 87.3 87.1  PLT 108* 106* 113* 92* 91*   Cardiac Enzymes: No results for input(s): CKTOTAL, CKMB, CKMBINDEX, TROPONINI in the last 168 hours. BNP (last 3 results) No results for input(s): PROBNP in the last 8760 hours. CBG:  Recent Labs Lab 02/14/15 0758 02/15/15 0749 02/16/15  0815 02/17/15 0804 02/18/15 0757  GLUCAP 135* 144* 148* 169* 127*   D-Dimer: No results for input(s): DDIMER in the last 72 hours. Hgb A1c: No results for input(s): HGBA1C in the last 72 hours. Lipid Profile: No results for input(s): CHOL, HDL, LDLCALC, TRIG, CHOLHDL, LDLDIRECT in the last 72 hours. Thyroid function studies:  Recent Labs  02/18/15 0535  TSH 0.751   Anemia work up: No results for input(s): VITAMINB12, FOLATE, FERRITIN, TIBC, IRON, RETICCTPCT in the last 72 hours. Sepsis Labs:  Recent Labs Lab 02/14/15 1050 02/16/15 0400 02/17/15 0344 02/18/15 0535  WBC 7.6 8.3 7.2 7.2   Microbiology No results found for this or any previous visit (from the past 240 hour(s)).   Medications:   . sodium chloride   Intravenous Once  . sodium chloride   Intravenous Once  . sodium chloride   Intravenous Once  . dexamethasone  4 mg Oral Daily  . furosemide  20 mg Intravenous Once  . metoprolol  50 mg Oral BID  . pantoprazole  40 mg Oral BID AC  . protein supplement shake  11 oz Oral BID BM  . sucralfate  1 g Oral TID WC & HS   Continuous Infusions: . sodium chloride 50 mL/hr at 02/16/15 0750    Time spent: 25 min   LOS: 6 days   Rodolfo Notaro MD PhD  [facility] Pager [phone]  *Please refer to [email or web address], password [email or web address] to get updated schedule on who will round on this patient, as hospitalists switch teams weekly. If 7PM-7AM,  please contact night-coverage at [email or web address], password [email or web address] for any overnight needs.  02/18/2015, 11:07 AM

## 2015-02-18 NOTE — Evaluation (Signed)
SLP Cancellation Note  Patient Details Name: Charles Luna MRN: [id number] DOB: July 10, 1940   Cancelled treatment:       Reason Eval/Treat Not Completed: Other (comment) (Per RN, pt with excessive bladder pain currently and palliative referral pending completion, will follow up )   Luanna Salk, [facility] SLP [phone]

## 2015-02-18 NOTE — Progress Notes (Signed)
PT Cancellation Note  Patient Details Name: Charles Luna Ey MRN: [id number] DOB: Aug 30, 1940   Cancelled Treatment:     C/M HgB  Plus C/R per family request to rest today.  Palliative meeting today.   Nathanial Rancher 02/18/2015, 12:08 PM

## 2015-02-18 NOTE — Progress Notes (Signed)
Now Mr. Janik is having problems with his urine. He was complaining of a lot of bladder pain. The nurses did a great job in trying to evaluate this. He had postvoid residuals checked. They're very low.  I just think that his cancer is becoming the real issue. I really do not see him ever being able to take treatment. I just don't think that he is going to be strong enough.  I probably would get palliative care involved now. I think they may help he and his family in this type of situation. Again, he seems to be declining.  His hemoglobin is only a 8.1. I think he needs to have 2 units of blood. I am sure that he probably still is. having some GI blood loss.   he is on Protonix.  I don't think that he is eating much. He is having no nausea or vomiting.  His potassiums 3.3. His albumin is only 1.6. I will have to recheck a prealbumin on him. It was 24 back 3 weeks ago. I'm sure that is less now.  He tried some physical therapy yesterday.  Of interest, his alkaline phosphatase is quite elevated area and this might be indicative of bony metastasis.  His corrected calcium is on the higher side. I think that we probably need to give him some Zometa.  He's had no bleeding that is obvious.  Overall, his performance status is ECOG 3. Unless he makes substantial improvement, I just do not think that he will be a candidate for any type of systemic therapy for the lung cancer.  Again, palliative care would not be a bad idea.  I will see about 2 units of blood.  The nurses on 8 W. I doing a fantastic job in trying to help his quality of life.  Loann Quill 16:19

## 2015-02-18 NOTE — Progress Notes (Signed)
Nutrition Brief Note  Chart reviewed. Pt now transitioning to comfort care.  No further nutrition interventions warranted at this time.  Please re-consult as needed.   Clayton Bibles, MS, RD, LDN Pager: [phone] After Hours Pager: [phone]

## 2015-02-18 NOTE — Progress Notes (Signed)
Pt has c/o feeling like he needs to urinate throughout shift and c/o pain in bladder area. In obvious discomfort and saying "I can't stand this". Frequent voids noted, but similar to his pattern since his admission. Getting up to [facility] to void. PVRs checked and have been 94cc, 31cc. Paged NP and received order for UA. Pt medicated with prn ultram. Continue to monitor. Hortencia Conradi RN

## 2015-02-19 ENCOUNTER — Ambulatory Visit: Payer: Medicare Other

## 2015-02-19 DIAGNOSIS — G893 Neoplasm related pain (acute) (chronic): Secondary | ICD-10-CM

## 2015-02-19 DIAGNOSIS — Z515 Encounter for palliative care: Secondary | ICD-10-CM

## 2015-02-19 DIAGNOSIS — R770 Abnormality of albumin: Secondary | ICD-10-CM

## 2015-02-19 LAB — TYPE AND SCREEN
ABO/RH(D): O POS
ANTIBODY SCREEN: NEGATIVE
UNIT DIVISION: 0
UNIT DIVISION: 0
Unit division: 0

## 2015-02-19 LAB — GLUCOSE, CAPILLARY: GLUCOSE-CAPILLARY: 97 mg/dL (ref 65–99)

## 2015-02-19 LAB — TESTOSTERONE: Testosterone: 30 ng/dL — ABNORMAL LOW (ref 348–1197)

## 2015-02-19 MED ORDER — LORAZEPAM 1 MG PO TABS: 1.0000 mg | ORAL_TABLET | ORAL | Status: AC | PRN

## 2015-02-19 MED ORDER — FENTANYL 12 MCG/HR TD PT72: 12.5000 ug | MEDICATED_PATCH | TRANSDERMAL | Status: AC

## 2015-02-19 MED ORDER — FENTANYL 12 MCG/HR TD PT72
12.5000 ug | MEDICATED_PATCH | TRANSDERMAL | Status: DC
  Administered 2015-02-19: 12.5 ug via TRANSDERMAL
  Filled 2015-02-19: qty 1

## 2015-02-19 MED ORDER — LORAZEPAM 1 MG PO TABS: 1.0000 mg | ORAL_TABLET | ORAL | Status: DC | PRN

## 2015-02-19 MED ORDER — PANTOPRAZOLE SODIUM 40 MG PO TBEC: 40.0000 mg | DELAYED_RELEASE_TABLET | Freq: Two times a day (BID) | ORAL | Status: AC

## 2015-02-19 MED ORDER — MORPHINE SULFATE (CONCENTRATE) 10 MG/0.5ML PO SOLN: 5.0000 mg | ORAL | Status: AC | PRN

## 2015-02-19 MED ORDER — DEXAMETHASONE 4 MG PO TABS: 4.0000 mg | ORAL_TABLET | Freq: Every day | ORAL | Status: AC

## 2015-02-19 MED ORDER — PANTOPRAZOLE SODIUM 40 MG PO TBEC
40.0000 mg | DELAYED_RELEASE_TABLET | Freq: Every day | ORAL | Status: DC
  Administered 2015-02-19: 40 mg via ORAL
  Filled 2015-02-19: qty 1

## 2015-02-19 MED ORDER — MORPHINE SULFATE (CONCENTRATE) 10 MG/0.5ML PO SOLN: 5.0000 mg | ORAL | Status: DC | PRN

## 2015-02-19 NOTE — Care Management Important Message (Signed)
Important Message  Patient Details  Name: Charles Luna MRN: [id number] Date of Birth: [birth year]   Medicare Important Message Given:  Yes    Camillo Flaming 02/19/2015, 11:28 AMImportant Message  Patient Details  Name: Charles Luna MRN: [id number] Date of Birth: 11-Dec-1940   Medicare Important Message Given:  Yes    Camillo Flaming 02/19/2015, 11:28 AM

## 2015-02-19 NOTE — Progress Notes (Signed)
Charles Luna has decided that he does not want to do any more therapy. He does not want any more interventions that will prolong his life. He is stopped radiation treatments.  I totally understand this. I just wonder make sure that he has comfort.  His prealbumin is only 3.6. I think this is a clear indicator as to how quickly he will decline.  He did not want any blood yesterday.  He has a Foley catheter in now. The bladder pain does not appear to be present.  I think a Duragesic patch would not be about a day for him. I think a low-dose patch would help keep him more relaxed.  I think that his prognosis is easily less than 1 month. As such, I believe that getting him over to pick in place would be the best option. I have spoken to the staff at [facility]. They will see about getting him a bed.  His wife would appreciate if he can go to he can place. He is not a candidate for any kind of rehabilitation.  Since he does not want any interventions done, I don't think we have to worry about being blood work on him. I'm sure that his hemoglobin will slowly trended downward.  On his physical exam, the vital signs all. To be relatively stable. There is nothing new on his physical exam.  Again, I think that [facility] would be the ideal location for him to stay. Again, he is not a candidate for any rehabilitation or skilled nursing from my point of view.  I appreciate all the gray care that he is gotten from the staff upon [name].  2 Timothy 1:5-7

## 2015-02-19 NOTE — Discharge Summary (Addendum)
Discharge Summary  Charles Luna XHB:[id number] DOB: 05/23/1940  PCP: Sherren Mocha, MD  Admit date: 02/12/2015 Discharge date: 02/19/2015  Time spent: >31mns  Recommendations for Outpatient Follow-up:  1. [facility]  Discharge Diagnoses:  Active Hospital Problems   Diagnosis Date Noted  . Acute blood loss anemia 02/12/2015  . Palliative care encounter 02/19/2015  . Pain, cancer 02/19/2015  . FTT (failure to thrive) in adult   . [place] ([place]   . Acute upper GI bleed 02/13/2015  . Dehydration 02/12/2015  . Thrombocytopenia ([place] 02/12/2015  . Dyslipidemia 02/12/2015  . Benign essential HTN 02/12/2015  . Weakness 02/12/2015  . Severe protein-calorie malnutrition ([place] 02/12/2015  . Lung cancer, primary, with metastasis from lung to other site ([facility] 01/27/2015    Resolved Hospital Problems   Diagnosis Date Noted Date Resolved  No resolved problems to display.    Discharge Condition: stable  Diet recommendation: regular diet  Filed Weights   02/12/15 1424 02/13/15 0513 02/16/15 0543  Weight: 69.3 kg (152 lb 12.5 oz) 72.8 kg (160 lb 7.9 oz) 72 kg (158 lb 11.7 oz)    History of present illness:  76year old male with previous medical history significant for asbestosis Bowsher, 50-pack-year history of tobacco use, stopped about 25 years ago. He was seen by Dr. EMarin Olpof oncology earlier in January of this year and this is for follow-up on his finding of metastatic brain cancer. [place] [facility] [place] [place] head imaging studies fair with multiple brain lesions. We don't have imaging studies of the CAT scan but [place] right upper lobe mass lesion with pulmonary nodules and hilar and mediastinal lymphadenopathy but no disease in other places. He is now starting radiation therapy under Dr. KSondra Come Because of the brain  lesions and vasogenic edema on MRI he was placed on Decadron.  [facility] of generalized weakness, poor by mouth intake, fatigue and overall not feeling well. There was no reports of respiratory distress but patient does say he has intermittent nonproductive cough. No reports of fevers or chills. No reports of abdominal pain, nausea or vomiting. No reports of diarrhea or constipation. No reports of blood in the stool or urine.  In ED, patient was hemodynamically stable. Blood work demonstrated hemoglobin of 9.7 and platelets of 144. ED MD spoke with the family and patient who reported DNR code status. Consult was placed to palliative care as well for goals of care   Hospital Course:  Principal Problem:   Acute blood loss anemia Active Problems:   Lung cancer, primary, with metastasis from lung to other site (HCC)   Dehydration   Thrombocytopenia (HCC)   Dyslipidemia   Benign essential HTN   Weakness   Severe protein-calorie malnutrition (HCC)   Acute upper GI bleed   Metastatic lung carcinoma (HCC)   FTT (failure to thrive) in adult   Palliative care encounter   Pain, cancer  Generalized weakness/dehydration, acute blood loss anemia/normocytic anemia/normocytic anemia In the setting of metastatic lung cancer with ongoing radiation and drop hemoglobin from 13 on 01/27/2015 to 7.7 on 02/13/2015. Transfuse 1 unit of packed red blood cells on 02/13/2015, oncology ordered 2 more unit of prbc on 1/26 due to continued weakness. It was later cancelled due to Patient refused more blood transfusion. [facility]  GI bleed with acute blood loss anemia and melena on presentation: He status post EGD On 1/22 showed a peptic ulcer disease.  Received ppi and carafate,  GI recommended Protonix twice a day for 4 weeks then continue Protonix indefinitely.   Primary lung cancer with metastatic brain  lesions with productive cough: On decadron, in house radiation therapy, last 1/24 with report fo decreased in blurred vision, does report weakness of right grips, port in place. Patient later stopped radiation oncology oncology input appreciated, due to poor ECOG score, less likely a candidate for chemo, oncology recommended palliative care consult Patient and family decided to start comfort measures and hospice placement  Thrombocytopenia: In the setting of malignancy. 110-120 range  Essential hypertension: Seems to be fairly control on metoprolol.  Hypokalemia: received  k supplement in the hospital, now on comfort measures and stop checking labs.  Bladder spasm: ua unremarkable.  flomax started, oncology ordered foley.   FTT: palliative care consulted, patient want comfort care and discharge to hospice facility   Family Communication: patient and wife in room Disposition Plan: comfort measures and hospice facility discharge Code Status: DNR              Procedures:  prbc transfusion x1 on 1/21 EGD On 1/22  In house radiation treatment to brain mets  Consultations:  Oncology  Radiation oncology  Palliative care  Discharge Exam: BP 126/59 mmHg  Pulse 76  Temp(Src) 97.4 F (36.3 C) (Oral)  Resp 16  Ht '5\' 10"'$  (1.778 m)  Wt 72 kg (158 lb 11.7 oz)  BMI 22.78 kg/m2  SpO2 95%  Gen: frail Cardiovascular: RRR. Chest and lungs: Crackles right lung field, left lung fields clears,  Abdomen: Abdomen soft, NT/ND, + BS Extremities: No edema.   Discharge Instructions You were cared for by a hospitalist during your hospital stay. If you have any questions about your discharge medications or the care you received while you were in the hospital after you are discharged, you can call the unit and asked to speak with the hospitalist on call if the hospitalist that took care of you is not available. Once you are discharged, your primary care physician will handle  any further medical issues. Please note that NO REFILLS for any discharge medications will be authorized once you are discharged, as it is imperative that you return to your primary care physician (or establish a relationship with a primary care physician if you do not have one) for your aftercare needs so that they can reassess your need for medications and monitor your lab values.      Discharge Instructions    Diet - low sodium heart healthy    Complete by:  As directed      Increase activity slowly    Complete by:  As directed             Medication List    STOP taking these medications        aspirin 81 MG tablet     CLARITIN 10 MG Caps  Generic drug:  Loratadine     hydrochlorothiazide 25 MG tablet  Commonly known as:  HYDRODIURIL     ibuprofen 200 MG tablet  Commonly known as:  ADVIL,MOTRIN     metoprolol 50 MG tablet  Commonly known as:  LOPRESSOR     naproxen sodium 220 MG tablet  Commonly known as:  ANAPROX     potassium chloride SA 20 MEQ tablet  Commonly known as:  K-DUR,KLOR-CON     rosuvastatin 10 MG tablet  Commonly known as:  CRESTOR     SONAFINE EX      TAKE these medications        dexamethasone 4 MG tablet  Commonly known as:  DECADRON  Take 1 tablet (4 mg total) by mouth daily.     fentaNYL 12 MCG/HR  Commonly known as:  DURAGESIC - dosed mcg/hr  Place 1 patch (12.5 mcg total) onto the skin every 3 (three) days.     LORazepam 1 MG tablet  Commonly known as:  ATIVAN  Take 1 tablet (1 mg total) by mouth every 4 (four) hours as needed for anxiety.     morphine CONCENTRATE 10 MG/0.5ML Soln concentrated solution  Take 0.25 mLs (5 mg total) by mouth every hour as needed for moderate pain, severe pain or shortness of breath.     nitroGLYCERIN 0.4 MG SL tablet  Commonly known as:  NITROSTAT  Place 1 tablet (0.4 mg total) under the tongue every 5 (five) minutes as needed for chest pain.     pantoprazole 40 MG tablet  Commonly known as:   PROTONIX  Take 1 tablet (40 mg total) by mouth 2 (two) times daily.     senna 8.6 MG tablet  Commonly known as:  SENOKOT  Take 1 tablet by mouth daily.       Allergies  Allergen Reactions  . Tylenol [Acetaminophen] Shortness Of Breath      The results of significant diagnostics from this hospitalization (including imaging, microbiology, ancillary and laboratory) are listed below for reference.    Significant Diagnostic Studies: Dg Chest 2 View  02/12/2015  CLINICAL DATA:  Shortness of breath ; lung carcinoma EXAM: CHEST  2 VIEW COMPARISON:  PET-CT February 02, 2015; chest radiograph January 25, 2012 FINDINGS: There is an irregular nodular lesion in the right upper lobe measuring 2.1 x 1.0 cm, better appreciable on the recent PET study. Multiple subcentimeter nodular lesions seen on recent CT study are not well seen by radiography. There is no appreciable edema or consolidation. Heart size and pulmonary vascularity are normal. There is right paratracheal region adenopathy. No bone lesions. IMPRESSION: Irregular nodular lesion right upper lobe, unchanged from recent PET study. No edema or consolidation. No change in cardiac silhouette. Right paratracheal adenopathy, again better delineated by CT. Electronically Signed   By: Lowella Grip III M.D.   On: 02/12/2015 12:50   Mr Jeri Cos AS Contrast  02/03/2015  CLINICAL DATA:  Recent diagnosis of lung cancer.  Staging. EXAM: MRI HEAD WITHOUT AND WITH CONTRAST TECHNIQUE: Multiplanar, multiecho pulse sequences of the brain and surrounding structures were obtained without and with intravenous contrast. CONTRAST:  34m MULTIHANCE GADOBENATE DIMEGLUMINE 529 MG/ML IV SOLN COMPARISON:  None. FINDINGS: There are on the order of 70 to 90 metastatic lesions scattered throughout the cerebellum and both cerebral hemispheres. Most of these are 3 mm or smaller in size. In the cerebellum, the largest lesion is in the inferior left cerebellum measuring 6 mm. No  significant edema or mass effect. In the right cerebral hemisphere, the largest lesion is in the right posterior parietal region measuring 15 mm in diameter with mild vasogenic edema. In the left cerebral hemisphere, the largest lesion is at the left parietal vertex measuring 11 mm in diameter with mild vasogenic edema. None of the lesions show gross hemorrhage. There is no hydrocephalus. No extra-axial collection. No widespread leptomeningeal enhancement. No skullbase metastasis seen. No pituitary mass.  No inflammatory sinus disease. IMPRESSION: Innumerable (70-90) metastatic lesions scattered throughout the brain. Largest cerebellar lesion measures 6 mm. Largest right hemispheric lesion measures 15 mm. Largest left hemispheric lesion measures 11 mm. No pronounced vasogenic edema. Mild vasogenic edema associated with the larger lesions. No mass effect or shift. No hemorrhage. Electronically Signed   By: Nelson Chimes M.D.   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 EXAM: NUCLEAR MEDICINE PET SKULL BASE TO THIGH TECHNIQUE: 9.7 mCi F-18 FDG was injected intravenously. Full-ring PET imaging was performed from the skull base to thigh after the radiotracer. CT data was obtained and used for attenuation correction and anatomic localization. FASTING BLOOD GLUCOSE:  Value: 140 mg/dl COMPARISON:  None. FINDINGS: NECK Sub-cm hypermetabolic lymph nodes seen in the supraclavicular regions bilaterally. CHEST Dominant multi lobulated mass in the right upper lobe measuring 2.5 cm is hypermetabolic, with SUV max of 12.3. Multiple adjacent satellite nodules in the right upper lobe are also hypermetabolic. There also scattered sub-cm pulmonary nodules seen in both lungs, which are too numerous to count. Multiple show metabolic activity, onsistent with bilateral pulmonary metastases. Hypermetabolic  lymphadenopathy is seen in right hilum, with SUV max of 20.0. Hypermetabolic lymphadenopathy is also seen in the mediastinum in the right paratracheal and subcarinal regions. SUV max of right paratracheal lymphadenopathy is 17.9. Mild hypermetabolic adenopathy is also seen in both axillary regions. Multiple small hypermetabolic foci are also seen within bilateral chest wall musculature and subcutaneous tissues which are too numerous to count, consistent with chest wall soft tissue metastases. ABDOMEN/PELVIS Diffuse hypermetabolic liver metastases are seen throughout the right and left lobes. Mild hypermetabolic lymphadenopathy seen within the upper abdomen and small bowel mesentery, consistent with metastatic disease. There are also multiple small hypermetabolic peritoneal nodules seen in the abdomen and pelvis, consistent with metastatic lymph nodes or peritoneal implants. Multiple small hypermetabolic metastases are seen throughout the iliopsoas muscles and bilateral gluteal muscles bilaterally. Several small hypermetabolic metastases are seen within subcutaneous tissues of the abdominal wall bilaterally. SKELETON Widespread hypermetabolic bone metastases are seen involving the pelvis, spine, and several bilateral ribs. IMPRESSION: Dominant hypermetabolic right upper lobe mass, consistent with primary bronchogenic carcinoma. Widespread metastatic disease throughout the neck, chest, abdomen, and pelvis as described above. In addition to widespread metastatic lymphadenopathy and diffuse liver metastases, there is widespread skeletal and body wall (intramuscular and subcutaneous) metastases. Electronically Signed   By: Earle Gell M.D.   On: 02/02/2015 15:34   Ir Fluoro Guide Cv Line Right  02/15/2015  CLINICAL DATA:  Metastatic lung carcinoma EXAM: RIGHT INTERNAL JUGULAR SINGLE LUMEN POWER PORT CATHETER INSERTION Date:  1/23/20171/23/2017 2:01 pm Radiologist:  M. Daryll Brod, MD Guidance:  ULTRASOUND AND  FLUOROSCOPIC FLUOROSCOPY TIME:  24 SECONDS, 2.5 MGY MEDICATIONS AND MEDICAL HISTORY: 2 G ANCEFadministered within 1 hour of the procedure.0.5 mg Versed ANESTHESIA/SEDATION: 31 minutes, the patient's level of consciousness and physiological status was monitored by radiology nursing. CONTRAST:  None. COMPLICATIONS: None immediate PROCEDURE: Informed consent was obtained from the patient following explanation of the procedure, risks, benefits and alternatives. The patient understands, agrees and consents for the procedure. All questions were addressed. A time out was performed. Maximal barrier sterile technique utilized including caps, mask, sterile gowns, sterile gloves, large sterile drape, hand hygiene, and 2% chlorhexidine scrub. Under sterile conditions and local anesthesia, right internal jugular micropuncture venous access was performed.  Access was performed with ultrasound. Images were obtained for documentation. A guide wire was inserted followed by a transitional dilator. This allowed insertion of a guide wire and catheter into the IVC. Measurements were obtained from the SVC / RA junction back to the right IJ venotomy site. In the right infraclavicular chest, a subcutaneous pocket was created over the second anterior rib. This was done under sterile conditions and local anesthesia. 1% lidocaine with epinephrine was utilized for this. A 2.5 cm incision was made in the skin. Blunt dissection was performed to create a subcutaneous pocket over the right pectoralis major muscle. The pocket was flushed with saline vigorously. There was adequate hemostasis. The port catheter was assembled and checked for leakage. The port catheter was secured in the pocket with two retention sutures. The tubing was tunneled subcutaneously to the right venotomy site and inserted into the SVC/RA junction through a valved peel-away sheath. Position was confirmed with fluoroscopy. Images were obtained for documentation. The patient  tolerated the procedure well. No immediate complications. Incisions were closed in a two layer fashion with 4 - 0 Vicryl suture. Dermabond was applied to the skin. The port catheter was accessed, blood was aspirated followed by saline and heparin flushes. Needle was removed. A dry sterile dressing was applied. IMPRESSION: Ultrasound and fluoroscopically guided right internal jugular single lumen power port catheter insertion. Tip in the SVC/RA junction. Catheter ready for use. Electronically Signed   By: Jerilynn Mages.  Shick M.D.   On: 02/15/2015 14:47   Ir US Guide Vasc Access Right  02/15/2015  CLINICAL DATA:  Metastatic lung carcinoma EXAM: RIGHT INTERNAL JUGULAR SINGLE LUMEN POWER PORT CATHETER INSERTION Date:  1/23/20171/23/2017 2:01 pm Radiologist:  M. Daryll Brod, MD Guidance:  ULTRASOUND AND FLUOROSCOPIC FLUOROSCOPY TIME:  24 SECONDS, 2.5 MGY MEDICATIONS AND MEDICAL HISTORY: 2 G ANCEFadministered within 1 hour of the procedure.0.5 mg Versed ANESTHESIA/SEDATION: 31 minutes, the patient's level of consciousness and physiological status was monitored by radiology nursing. CONTRAST:  None. COMPLICATIONS: None immediate PROCEDURE: Informed consent was obtained from the patient following explanation of the procedure, risks, benefits and alternatives. The patient understands, agrees and consents for the procedure. All questions were addressed. A time out was performed. Maximal barrier sterile technique utilized including caps, mask, sterile gowns, sterile gloves, large sterile drape, hand hygiene, and 2% chlorhexidine scrub. Under sterile conditions and local anesthesia, right internal jugular micropuncture venous access was performed. Access was performed with ultrasound. Images were obtained for documentation. A guide wire was inserted followed by a transitional dilator. This allowed insertion of a guide wire and catheter into the IVC. Measurements were obtained from the SVC / RA junction back to the right IJ venotomy  site. In the right infraclavicular chest, a subcutaneous pocket was created over the second anterior rib. This was done under sterile conditions and local anesthesia. 1% lidocaine with epinephrine was utilized for this. A 2.5 cm incision was made in the skin. Blunt dissection was performed to create a subcutaneous pocket over the right pectoralis major muscle. The pocket was flushed with saline vigorously. There was adequate hemostasis. The port catheter was assembled and checked for leakage. The port catheter was secured in the pocket with two retention sutures. The tubing was tunneled subcutaneously to the right venotomy site and inserted into the SVC/RA junction through a valved peel-away sheath. Position was confirmed with fluoroscopy. Images were obtained for documentation. The patient tolerated the procedure well. No immediate complications. Incisions were closed in a two layer fashion with  4 - 0 Vicryl suture. Dermabond was applied to the skin. The port catheter was accessed, blood was aspirated followed by saline and heparin flushes. Needle was removed. A dry sterile dressing was applied. IMPRESSION: Ultrasound and fluoroscopically guided right internal jugular single lumen power port catheter insertion. Tip in the SVC/RA junction. Catheter ready for use. Electronically Signed   By: Jerilynn Mages.  Shick M.D.   On: 02/15/2015 14:47   Dg Chest Port 1 View  02/17/2015  CLINICAL DATA:  Cough EXAM: PORTABLE CHEST 1 VIEW COMPARISON:  02/12/2015 FINDINGS: Cardiomediastinal silhouette is stable. Nodular lesion in right upper lobe again noted. There is streaky right upper lobe perihilar atelectasis or early infiltrate. No pulmonary edema. Right IJ Port-A-Cath in place with tip in SVC right atrium junction. IMPRESSION: . Nodular lesion in right upper lobe again noted. There is streaky right upper lobe perihilar atelectasis or early infiltrate. No pulmonary edema. Electronically Signed   By: Lahoma Crocker M.D.   On: 02/17/2015  13:27    Microbiology: No results found for this or any previous visit (from the past 240 hour(s)).   Labs: Basic Metabolic Panel:  Recent Labs Lab 02/12/15 1151 02/13/15 0426 02/16/15 0400 02/17/15 0344 02/18/15 0535  NA 135 136 137 135 135  K 3.8 4.2 4.7 3.3* 3.3*  CL 96* 101 106 104 104  CO2 '25 26 24 23 24  '$ GLUCOSE 214* 166* 125* 119* 124*  BUN 28* 26* '17 16 14  '$ CREATININE 0.77 0.73 0.75 0.52* 0.46*  CALCIUM 9.7 8.2* 8.3* 8.1* 8.0*   Liver Function Tests:  Recent Labs Lab 02/13/15 0426 02/17/15 0344 02/18/15 0535  AST 69* 102* 90*  ALT 76* 95* 96*  ALKPHOS 156* 275* 277*  BILITOT 1.0 0.7 1.0  PROT 4.9* 4.8* 4.7*  ALBUMIN 1.8* 1.6* 1.6*   No results for input(s): LIPASE, AMYLASE in the last 168 hours. No results for input(s): AMMONIA in the last 168 hours. CBC:  Recent Labs Lab 02/13/15 0426 02/14/15 1050 02/16/15 0400 02/17/15 0344 02/18/15 0535  WBC 6.6 7.6 8.3 7.2 7.2  NEUTROABS  --   --   --  6.4 6.7  HGB 7.7* 9.0* 9.4* 8.8* 8.1*  HCT 23.7* 27.8* 29.1* 26.7* 25.0*  MCV 90.1 85.5 88.2 87.3 87.1  PLT 108* 106* 113* 92* 91*   Cardiac Enzymes: No results for input(s): CKTOTAL, CKMB, CKMBINDEX, TROPONINI in the last 168 hours. BNP: BNP (last 3 results) No results for input(s): BNP in the last 8760 hours.  ProBNP (last 3 results) No results for input(s): PROBNP in the last 8760 hours.  CBG:  Recent Labs Lab 02/15/15 0749 02/16/15 0815 02/17/15 0804 02/18/15 0757 02/19/15 0733  GLUCAP 144* 148* 169* 127* 97       Signed:  Fionnuala Hemmerich MD, PhD  [facility] 02/19/2015, 10:40 AM

## 2015-02-19 NOTE — Consult Note (Signed)
[place] Liaison: Received request from [place] for family interest in [facility] and [place] received request from Dr. Marin Olp earlier this morning. Chart reviewed and met with family at bedside to complete paper work. Dr. Orpah Melter to assume care per family request.   RN please call report to [phone].  Please fax discharge summary to [phone].  Thank you.  Erling Conte, [place]

## 2015-02-19 NOTE — Progress Notes (Addendum)
CSW continuing to follow.   CSW received notification that recommendation is for [facility] and oncologist had discussion with pt and pt family this morning. CSW made referral to [facility], Erling Conte.   CSW has also been assisting with process to get visit from pt son whom is incarcerated. CSW received return phone call from [place]. Chaplain Crumpler discussed with CSW that correctional facility needs letter from the hospital stating that pt death is imminent in order for request to be processed. Chaplain Crumpler provided other information as well in which CSW provided to pt family. CSW obtained letter from Palliative Nurse Practioner, Wadie Lessen and faxed to [facility]. CSW will contact Chaplain Crumpler today to ensure letter received and provide information for [facility] and pt family contacts.   CSW received notification from [facility], Erling Conte that pt has bed available at [facility] today.  CSW facilitated pt discharge needs including contacting facility, faxing pt discharge information, discussing with pt and pt family at bedside, providing RN phone number to call report, and arranging ambulance transport for pt to [facility].   Pt family appreciative of CSW assistance and support.  No further social work needs identified at this time.  CSW signing off.   Addendum:  CSW was able to reach [employer] and confirm they received letter faxed this morning. CSW notified Chaplain Crumpler that pt transferred to [facility] this afternoon and provided contact information for [employer]. CSW provided Chaplain Crumpler with pt family contact names and phone numbers. CSW provided [employer], Erling Conte with information for [employer].  Pt discharge to [facility] today and [place]  worker will continue to follow process with request for pt son visit.    Alison Murray, MSW, [place] [employer] [phone]

## 2015-02-22 ENCOUNTER — Ambulatory Visit: Payer: Medicare Other

## 2015-02-23 ENCOUNTER — Ambulatory Visit: Payer: Medicare Other

## 2015-02-23 ENCOUNTER — Encounter: Payer: Self-pay | Admitting: Radiation Oncology

## 2015-02-24 ENCOUNTER — Ambulatory Visit: Payer: Medicare Other

## 2015-02-24 DEATH — deceased

## 2015-02-25 ENCOUNTER — Ambulatory Visit: Payer: Medicare Other

## 2015-02-26 ENCOUNTER — Ambulatory Visit: Payer: Medicare Other

## 2015-03-01 ENCOUNTER — Ambulatory Visit: Payer: Medicare Other

## 2015-03-01 DIAGNOSIS — I10 Essential (primary) hypertension: Secondary | ICD-10-CM | POA: Insufficient documentation

## 2015-03-01 DIAGNOSIS — R1013 Epigastric pain: Secondary | ICD-10-CM | POA: Insufficient documentation

## 2015-03-01 NOTE — Progress Notes (Signed)
  Radiation Oncology         [phone] ________________________________  Name: Charles Luna MRN: [id number]  Date: 02/17/2015  DOB: Nov 25, 1940  End of Treatment Note   ICD-9-CM ICD-10-CM    1. Lung cancer, primary, with metastasis from lung to other site, unspecified laterality ([facility]) 162.9 C34.90     DIAGNOSIS: Stage IV lung cancer with brain metastasis     Indication for treatment:  Brain metastasis       Radiation treatment dates:   02/08/2015-02/17/2015  Site/dose:   Whole brain 17.5 gray   Beams/energy:   Lateral fields encompassing the whole brain  Narrative: The patient tolerated radiation treatment relatively well.   Patient completed an abbreviated course of therapy. He was originally scheduled to receive 35 gray however required admission to the hospital for weakness and anemia as well as melena. The patient was found to have a peptic ulcer. His overall clinical course deteriorated and the patient elected to discontinue his whole brain radiotherapy and proceed with hospice  Plan: When necessary follow-up -----------------------------------  Blair Promise, PhD, MD

## 2015-04-07 ENCOUNTER — Ambulatory Visit: Payer: Medicare Other | Admitting: Cardiovascular Disease

## 2015-05-14 ENCOUNTER — Encounter: Payer: Self-pay | Admitting: Hematology & Oncology

## 2016-07-22 IMAGING — MR MR HEAD WO/W CM
9 of 11 series · 27 of 48 positions shown · IV contrast (multihance)
Comparison: None.

CLINICAL DATA: Recent diagnosis of lung cancer.  Staging.

EXAM:
MRI HEAD WITHOUT AND WITH CONTRAST
TECHNIQUE: Multiplanar, multiecho pulse sequences of the brain and surrounding
structures were obtained without and with intravenous contrast.
CONTRAST:  14mL MULTIHANCE GADOBENATE DIMEGLUMINE 529 MG/ML IV SOLN

[Series 3: DWI · axial · 5.0mm · 1.80mm/px · z∈[-58,+103]mm · 3 of 49 slices shown (1 of 2)]
[im 1/49]
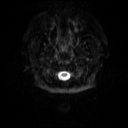
[im 25/49]
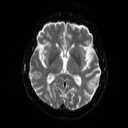
[im 49/49]
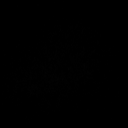

[Series 4: DWI · axial · 5.0mm · 1.80mm/px · 1 of 24 slices shown (2 of 2)]
[im 1/24]
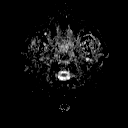

[Series 7: T2 · axial · 5.0mm · 0.51mm/px · z∈[-47,+113]mm · 2 of 25 slices shown (1 of 2)]
[im 1/25]
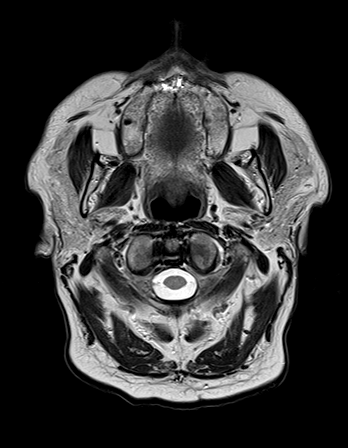
[im 25/25]
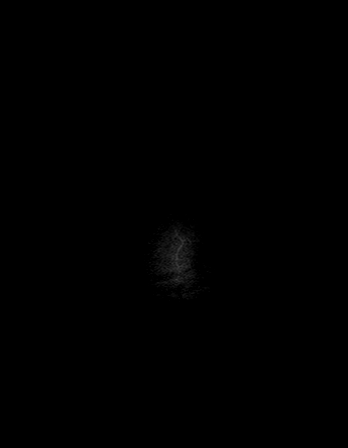

[Series 8: FLAIR · axial · 1.0mm · 0.45mm/px · z∈[-48,+114]mm · 6 of 83 slices shown]
[im 1/83]
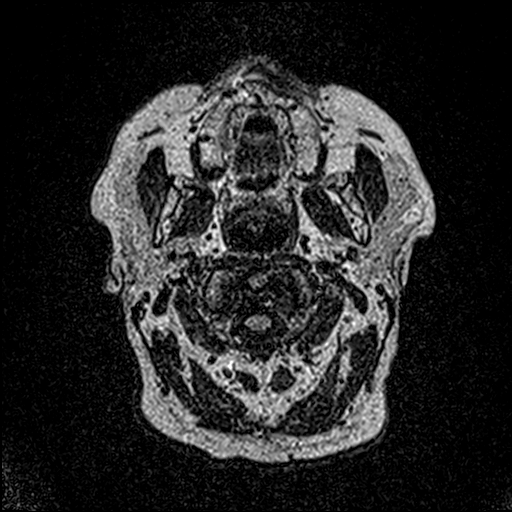
[im 17/83]
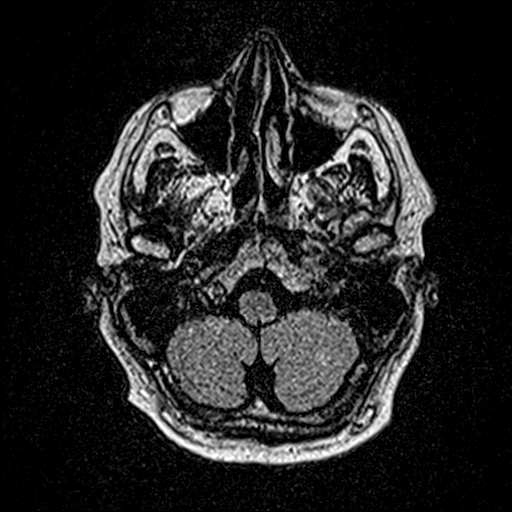
[im 33/83]
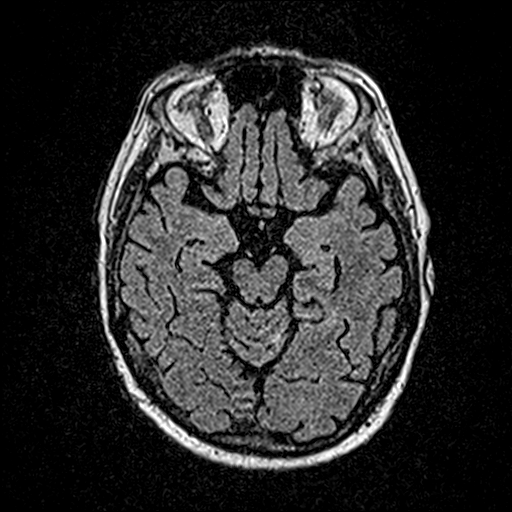
[im 50/83]
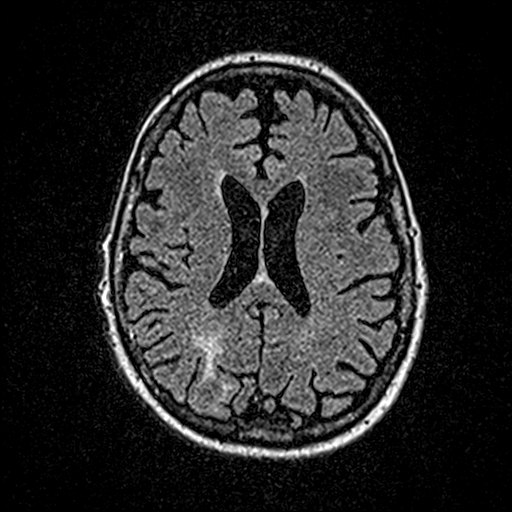
[im 66/83]
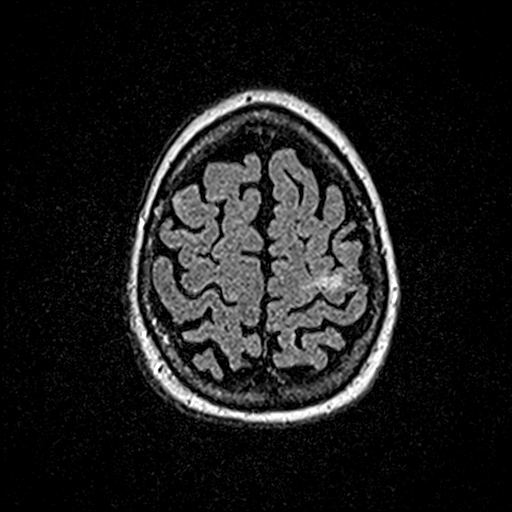
[im 83/83]
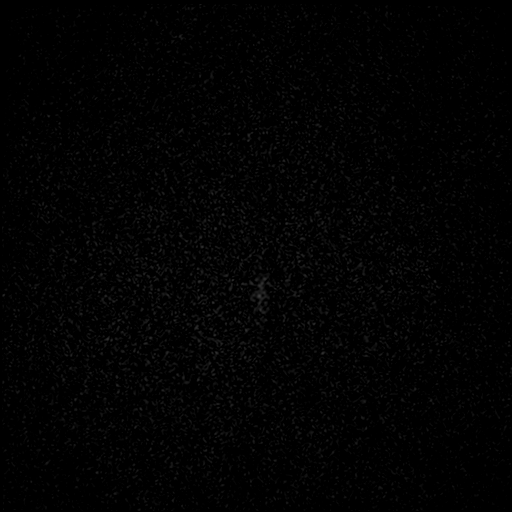

[Series 9: t1_mpr_tra · axial · 1.0mm · 0.45mm/px · z∈[-45,+40]mm · 5 of 160 slices shown]
[im 1/160]
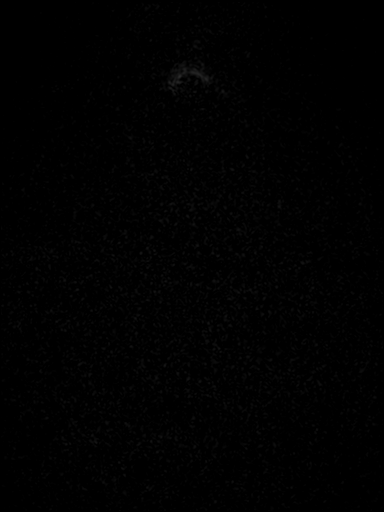
[im 29/160]
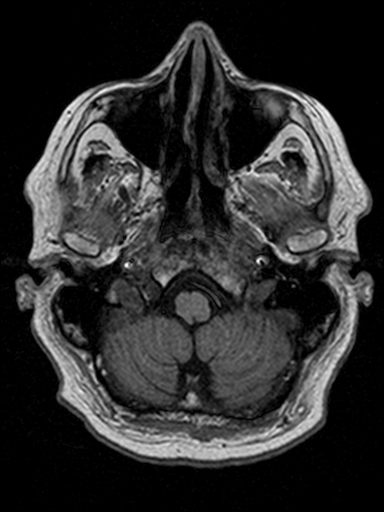
[im 44/160]
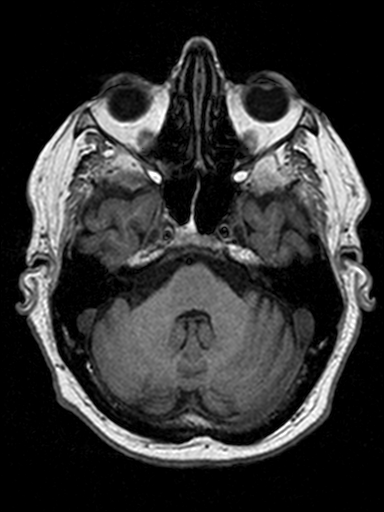
[im 73/160]
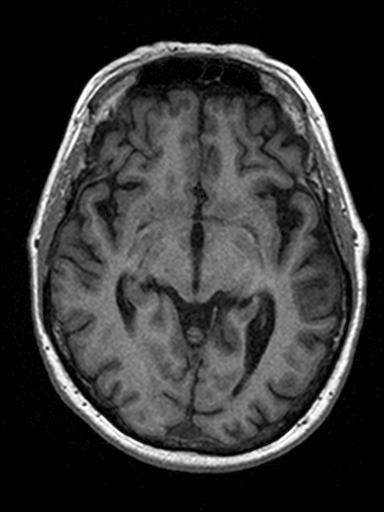
[im 87/160]
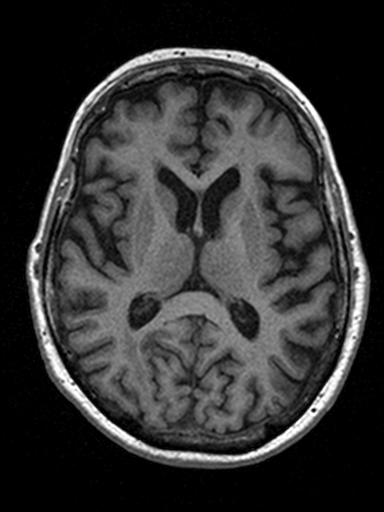

[Series 10: T1 · sagittal · 3.0mm · 0.45mm/px · 2 of 27 slices shown]
[im 1/27]
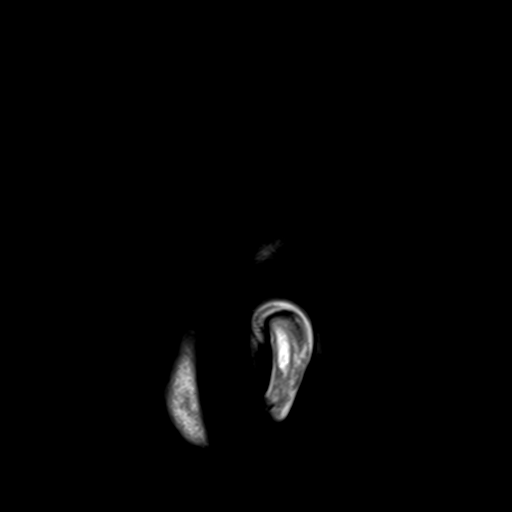
[im 27/27]
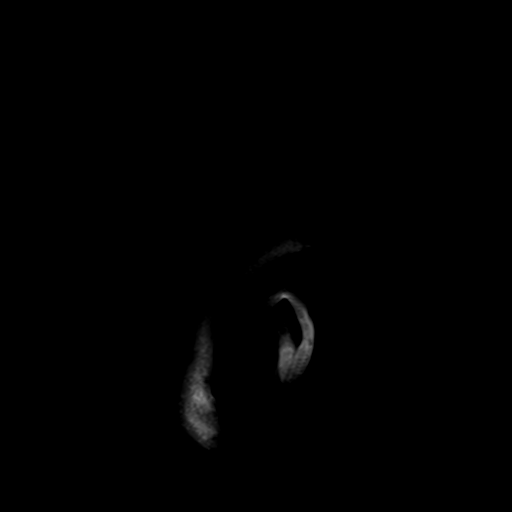

[Series 12: T2 · coronal · 3.0mm · 0.43mm/px · 3 of 43 slices shown (2 of 2)]
[im 1/43]
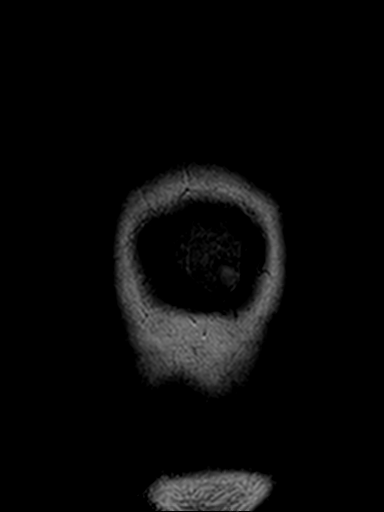
[im 22/43]
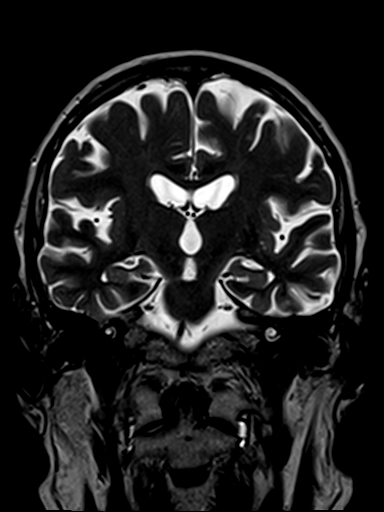
[im 43/43]
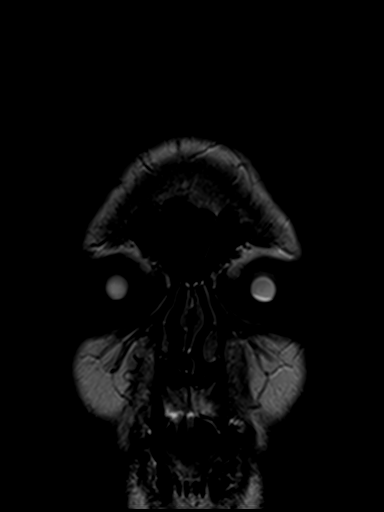

[Series 14: T1 post-contrast · coronal · 3.0mm · 0.57mm/px · 3 of 43 slices shown (1 of 2)]
[im 1/43]
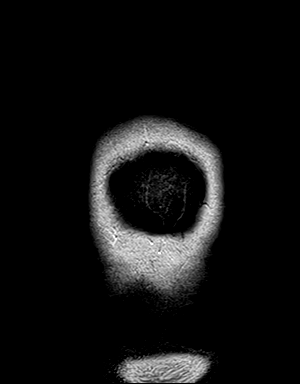
[im 22/43]
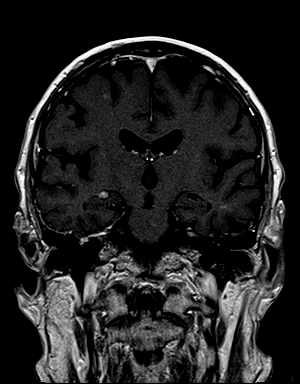
[im 43/43]
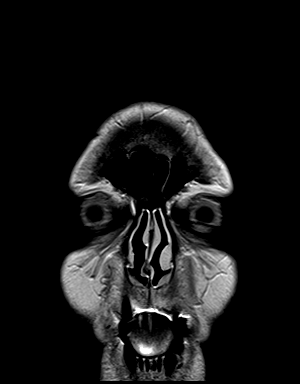

[Series 15: T1 post-contrast · sagittal · 3.0mm · 0.45mm/px · 2 of 27 slices shown (2 of 2)]
[im 1/27]
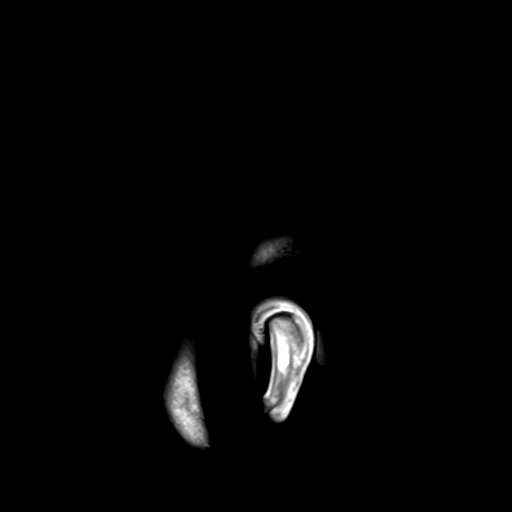
[im 27/27]
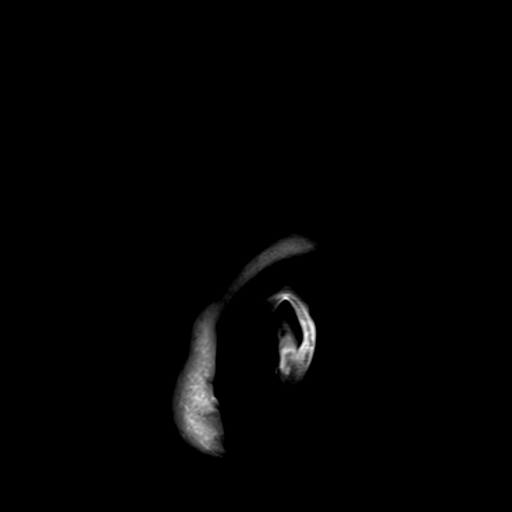

[27 of 48 positions shown; findings below may reference images not displayed]

FINDINGS: There are on the order of 70 to 90 metastatic lesions scattered
throughout the cerebellum and both cerebral hemispheres. Most of
these are 3 mm or smaller in size. In the cerebellum, the largest
lesion is in the inferior left cerebellum measuring 6 mm. No
significant edema or mass effect. In the right cerebral hemisphere,
the largest lesion is in the right posterior parietal region
measuring 15 mm in diameter with mild vasogenic edema. In the left
cerebral hemisphere, the largest lesion is at the left parietal
vertex measuring 11 mm in diameter with mild vasogenic edema. None
of the lesions show gross hemorrhage. There is no hydrocephalus. No
extra-axial collection. No widespread leptomeningeal enhancement. No
skullbase metastasis seen. No pituitary mass. No inflammatory sinus
disease.
IMPRESSION: Innumerable (70-90) metastatic lesions scattered throughout the
brain. Largest cerebellar lesion measures 6 mm. Largest right
hemispheric lesion measures 15 mm. Largest left hemispheric lesion
measures 11 mm. No pronounced vasogenic edema. Mild vasogenic edema
associated with the larger lesions. No mass effect or shift. No
hemorrhage.

## 2016-08-03 IMAGING — XA IR US GUIDE VASC ACCESS RIGHT
1 series · 1 of 1 positions shown · non-contrast
Comparison: none

CLINICAL DATA: Metastatic lung carcinoma

[Series 300: ir us guide vasc access right&&ir fluoro · 1 of 1 slices shown]
[im 1/1]
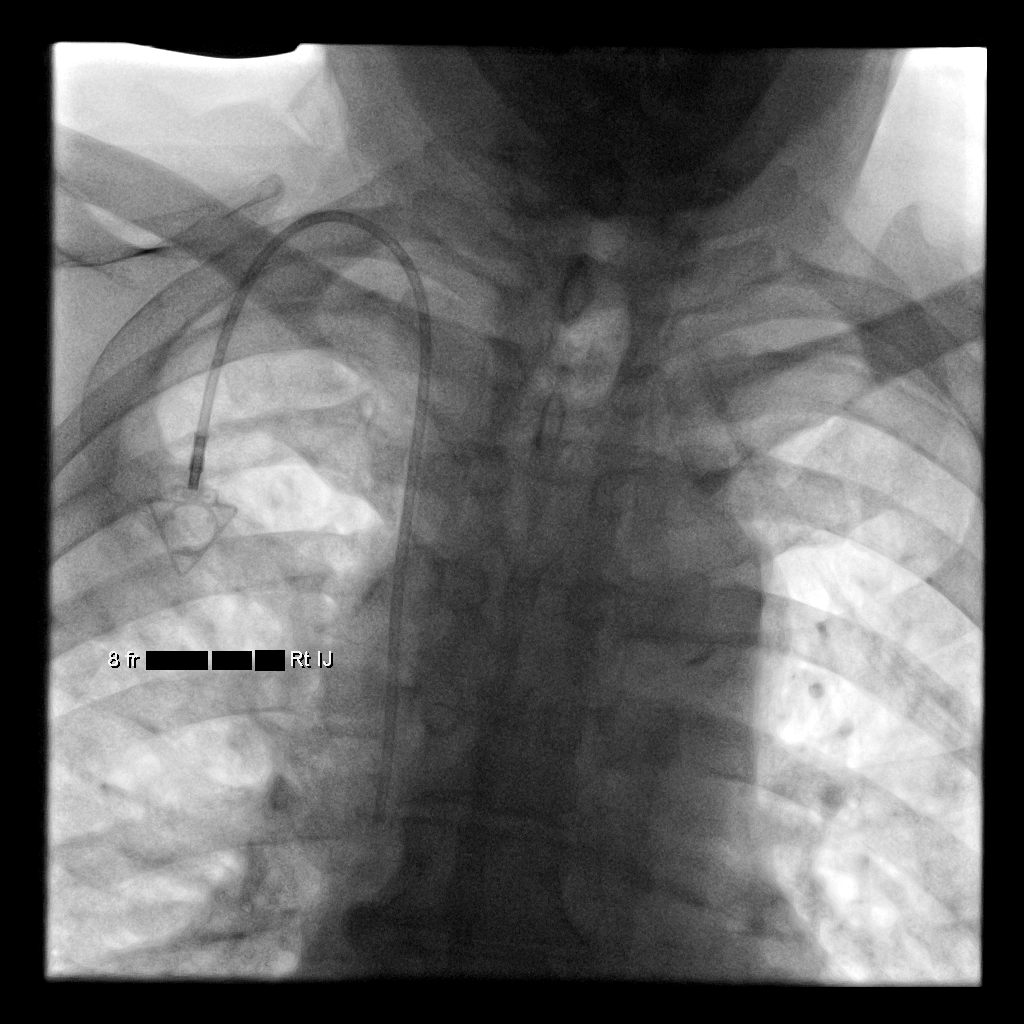

[1 of 1 positions shown; findings below may reference images not displayed]

EXAM:
RIGHT INTERNAL JUGULAR SINGLE LUMEN POWER PORT CATHETER INSERTION

Date:  [DATE] [DATE]

Radiologist:  Al Junaibi, Mhra

Guidance:  ULTRASOUND AND FLUOROSCOPIC

FLUOROSCOPY TIME:  24 SECONDS, 2.5 MGY

MEDICATIONS AND MEDICAL HISTORY:
2 G ANCEFadministered within 1 hour of the procedure.0.5 mg Versed

ANESTHESIA/SEDATION:
31 minutes, the patient's level of consciousness and physiological
status was monitored by radiology nursing.

CONTRAST:  None.

COMPLICATIONS:
None immediate

PROCEDURE:
Informed consent was obtained from the patient following explanation
of the procedure, risks, benefits and alternatives. The patient
understands, agrees and consents for the procedure. All questions
were addressed. A time out was performed.

Maximal barrier sterile technique utilized including caps, mask,
sterile gowns, sterile gloves, large sterile drape, hand hygiene,
and 2% chlorhexidine scrub.

Under sterile conditions and local anesthesia, right internal
jugular micropuncture venous access was performed. Access was
performed with ultrasound. Images were obtained for documentation. A
guide wire was inserted followed by a transitional dilator. This
allowed insertion of a guide wire and catheter into the IVC.
Measurements were obtained from the SVC / RA junction back to the
right IJ venotomy site. In the right infraclavicular chest, a
subcutaneous pocket was created over the second anterior rib. This
was done under sterile conditions and local anesthesia. 1% lidocaine
with epinephrine was utilized for this. A 2.5 cm incision was made
in the skin. Blunt dissection was performed to create a subcutaneous
pocket over the right pectoralis major muscle. The pocket was
flushed with saline vigorously. There was adequate hemostasis. The
port catheter was assembled and checked for leakage. The port
catheter was secured in the pocket with two retention sutures. The
tubing was tunneled subcutaneously to the right venotomy site and
inserted into the SVC/RA junction through a valved peel-away sheath.
Position was confirmed with fluoroscopy. Images were obtained for
documentation. The patient tolerated the procedure well. No
immediate complications. Incisions were closed in a two layer
fashion with 4 - 0 Vicryl suture. Dermabond was applied to the skin.
The port catheter was accessed, blood was aspirated followed by
saline and heparin flushes. Needle was removed. A dry sterile
dressing was applied.
IMPRESSION: Ultrasound and fluoroscopically guided right internal jugular single
lumen power port catheter insertion. Tip in the SVC/RA junction.
Catheter ready for use.

## 2020-07-27 ENCOUNTER — Other Ambulatory Visit: Payer: Self-pay | Admitting: Radiology

## 2020-07-27 NOTE — Telephone Encounter (Signed)
error
